# Patient Record
Sex: Male | Born: 1985 | Race: Black or African American | Hispanic: No | Marital: Married | Smoking: Current every day smoker
Health system: Southern US, Community
[De-identification: ages and names within clinical notes are randomized; demographics above are authoritative.]

## PROBLEM LIST (undated history)

## (undated) DIAGNOSIS — E119 Type 2 diabetes mellitus without complications: Secondary | ICD-10-CM

---

## 2011-10-13 ENCOUNTER — Emergency Department: Payer: Self-pay | Admitting: Emergency Medicine

## 2011-12-16 ENCOUNTER — Emergency Department: Payer: Self-pay | Admitting: Emergency Medicine

## 2013-03-16 ENCOUNTER — Emergency Department: Payer: Self-pay | Admitting: Emergency Medicine

## 2014-05-31 ENCOUNTER — Emergency Department: Payer: Self-pay | Admitting: Emergency Medicine

## 2014-05-31 LAB — COMPREHENSIVE METABOLIC PANEL
ALK PHOS: 51 U/L
Albumin: 3.8 g/dL (ref 3.4–5.0)
Anion Gap: 9 (ref 7–16)
BUN: 16 mg/dL (ref 7–18)
Bilirubin,Total: 0.4 mg/dL (ref 0.2–1.0)
CALCIUM: 9 mg/dL (ref 8.5–10.1)
CHLORIDE: 105 mmol/L (ref 98–107)
CREATININE: 1.19 mg/dL (ref 0.60–1.30)
Co2: 25 mmol/L (ref 21–32)
EGFR (Non-African Amer.): >60
GLUCOSE: 123 mg/dL — AB (ref 65–99)
OSMOLALITY: 280 (ref 275–301)
Potassium: 3.8 mmol/L (ref 3.5–5.1)
SGOT(AST): 31 U/L (ref 15–37)
SGPT (ALT): 63 U/L
SODIUM: 139 mmol/L (ref 136–145)
TOTAL PROTEIN: 8.5 g/dL — AB (ref 6.4–8.2)

## 2014-05-31 LAB — URINALYSIS, COMPLETE
BACTERIA: NONE SEEN
Bilirubin,UR: NEGATIVE
Blood: NEGATIVE
GLUCOSE, UR: NEGATIVE mg/dL (ref 0–75)
KETONE: NEGATIVE
Leukocyte Esterase: NEGATIVE
Nitrite: NEGATIVE
PROTEIN: NEGATIVE
Ph: 5 (ref 4.5–8.0)
Specific Gravity: 1.024 (ref 1.003–1.030)

## 2014-05-31 LAB — CBC WITH DIFFERENTIAL/PLATELET
BASOS ABS: 0 10*3/uL (ref 0.0–0.1)
Basophil %: 0.1 %
EOS PCT: 0.7 %
Eosinophil #: 0.1 10*3/uL (ref 0.0–0.7)
HCT: 52.9 % — AB (ref 40.0–52.0)
HGB: 17.5 g/dL (ref 13.0–18.0)
LYMPHS ABS: 0.5 10*3/uL — AB (ref 1.0–3.6)
LYMPHS PCT: 5.6 %
MCH: 29.4 pg (ref 26.0–34.0)
MCHC: 33.1 g/dL (ref 32.0–36.0)
MCV: 89 fL (ref 80–100)
Monocyte #: 0.4 x10 3/mm (ref 0.2–1.0)
Monocyte %: 4.7 %
NEUTROS ABS: 8.1 10*3/uL — AB (ref 1.4–6.5)
NEUTROS PCT: 88.9 %
PLATELETS: 165 10*3/uL (ref 150–440)
RBC: 5.94 10*6/uL — ABNORMAL HIGH (ref 4.40–5.90)
RDW: 14.9 % — ABNORMAL HIGH (ref 11.5–14.5)
WBC: 9.1 10*3/uL (ref 3.8–10.6)

## 2014-09-15 ENCOUNTER — Emergency Department: Payer: Self-pay | Admitting: Emergency Medicine

## 2015-04-05 ENCOUNTER — Emergency Department
Admission: EM | Admit: 2015-04-05 | Discharge: 2015-04-05 | Discharge status: Against Medical Advice | Payer: Self-pay | Attending: Emergency Medicine | Admitting: Emergency Medicine

## 2015-04-05 DIAGNOSIS — J00 Acute nasopharyngitis [common cold]: Secondary | ICD-10-CM | POA: Insufficient documentation

## 2015-04-05 DIAGNOSIS — R0981 Nasal congestion: Secondary | ICD-10-CM | POA: Insufficient documentation

## 2015-04-05 NOTE — ED Notes (Signed)
Congestion x 1 week. Cold sx's.

## 2015-09-28 ENCOUNTER — Emergency Department
Admission: EM | Admit: 2015-09-28 | Discharge: 2015-09-28 | Disposition: A | Discharge status: Home / Self Care | Payer: Self-pay | Attending: Emergency Medicine | Admitting: Emergency Medicine

## 2015-09-28 ENCOUNTER — Encounter: Payer: Self-pay | Admitting: Emergency Medicine

## 2015-09-28 DIAGNOSIS — X58XXXA Exposure to other specified factors, initial encounter: Secondary | ICD-10-CM | POA: Insufficient documentation

## 2015-09-28 DIAGNOSIS — Y9289 Other specified places as the place of occurrence of the external cause: Secondary | ICD-10-CM | POA: Insufficient documentation

## 2015-09-28 DIAGNOSIS — F17200 Nicotine dependence, unspecified, uncomplicated: Secondary | ICD-10-CM | POA: Insufficient documentation

## 2015-09-28 DIAGNOSIS — Y998 Other external cause status: Secondary | ICD-10-CM | POA: Insufficient documentation

## 2015-09-28 DIAGNOSIS — T782XXA Anaphylactic shock, unspecified, initial encounter: Secondary | ICD-10-CM | POA: Insufficient documentation

## 2015-09-28 DIAGNOSIS — L500 Allergic urticaria: Secondary | ICD-10-CM | POA: Insufficient documentation

## 2015-09-28 DIAGNOSIS — Y9389 Activity, other specified: Secondary | ICD-10-CM | POA: Insufficient documentation

## 2015-09-28 MED ORDER — EPINEPHRINE 0.3 MG/0.3ML IJ SOAJ
0.3000 mg | Freq: Once | INTRAMUSCULAR | Status: AC
  Administered 2015-09-28: 0.3 mg via INTRAMUSCULAR

## 2015-09-28 MED ORDER — DIPHENHYDRAMINE HCL 50 MG/ML IJ SOLN
25.0000 mg | Freq: Once | INTRAMUSCULAR | Status: AC
  Administered 2015-09-28: 25 mg via INTRAVENOUS
  Filled 2015-09-28: qty 1

## 2015-09-28 MED ORDER — METHYLPREDNISOLONE SODIUM SUCC 125 MG IJ SOLR
125.0000 mg | Freq: Once | INTRAMUSCULAR | Status: AC
  Administered 2015-09-28: 125 mg via INTRAVENOUS
  Filled 2015-09-28: qty 2

## 2015-09-28 MED ORDER — FAMOTIDINE IN NACL 20-0.9 MG/50ML-% IV SOLN
20.0000 mg | Freq: Once | INTRAVENOUS | Status: AC
  Administered 2015-09-28: 20 mg via INTRAVENOUS
  Filled 2015-09-28: qty 50

## 2015-09-28 MED ORDER — EPINEPHRINE 0.3 MG/0.3ML IJ SOAJ: 0.3000 mg | Freq: Once | INTRAMUSCULAR | Status: DC

## 2015-09-28 MED ORDER — PREDNISONE 10 MG PO TABS: 50.0000 mg | ORAL_TABLET | Freq: Every day | ORAL | Status: DC

## 2015-09-28 NOTE — ED Notes (Signed)
Pt with unknown allergic reaction, rash and arms, chest legs. States that throat feels like its swelling. No resp. Issues at this time.

## 2015-09-28 NOTE — Discharge Instructions (Signed)
You were evaluated after allergic reaction, and although I'm not certain what you are allergic to, it was systemic what we call anaphylaxis.  We discussed, you should continue to take Benadryl over-the-counter every 4 hours for the next 24 hours, and then as needed for any itching or hives.  You should also take over-the-counter Zantac 150 mg once daily for the next 5 days. You're being prescribed prednisone to help keep allergic symptoms down for the next 5 days.  You are being prescribed EpiPen, that you need to keep with you at all times, for use if you have throat swelling or trouble breathing associated with allergic reaction and you cannot access medical care. If you use your EpiPen, you need to call 911 and come to the emergency room for further monitoring.  Return to the emergency department for any worsening condition including lip or throat swelling, trouble breathing, trouble swallowing, dizziness or passing out, or any other symptoms concerning to. I recommended follow-up with the ears nose throat physician for possible allergy testing to see if we might be able to find what you are allergic to.   Anaphylactic Reaction An anaphylactic reaction is a sudden, severe allergic reaction that involves the whole body. It can be life threatening. A hospital stay is often required. People with asthma, eczema, or hay fever are slightly more likely to have an anaphylactic reaction. CAUSES  An anaphylactic reaction may be caused by anything to which you are allergic. After being exposed to the allergic substance, your immune system becomes sensitized to it. When you are exposed to that allergic substance again, an allergic reaction can occur. Common causes of an anaphylactic reaction include:  Medicines.  Foods, especially peanuts, wheat, shellfish, milk, and eggs.  Insect bites or stings.  Blood products.  Chemicals, such as dyes, latex, and contrast material used for imaging  tests. SYMPTOMS  When an allergic reaction occurs, the body releases histamine and other substances. These substances cause symptoms such as tightening of the airway. Symptoms often develop within seconds or minutes of exposure. Symptoms may include:  Skin rash or hives.  Itching.  Chest tightness.  Swelling of the eyes, tongue, or lips.  Trouble breathing or swallowing.  Lightheadedness or fainting.  Anxiety or confusion.  Stomach pains, vomiting, or diarrhea.  Nasal congestion.  A fast or irregular heartbeat (palpitations). DIAGNOSIS  Diagnosis is based on your history of recent exposure to allergic substances, your symptoms, and a physical exam. Your caregiver may also perform blood or urine tests to confirm the diagnosis. TREATMENT  Epinephrine medicine is the main treatment for an anaphylactic reaction. Other medicines that may be used for treatment include antihistamines, steroids, and albuterol. In severe cases, fluids and medicine to support blood pressure may be given through an intravenous line (IV). Even if you improve after treatment, you need to be observed to make sure your condition does not get worse. This may require a stay in the hospital. [place] a medical alert bracelet or necklace stating your allergy.  You and your family must learn how to use an anaphylaxis kit or give an epinephrine injection to temporarily treat an emergency allergic reaction. Always carry your epinephrine injection or anaphylaxis kit with you. This can be lifesaving if you have a severe reaction.  Do not drive or perform tasks after treatment until the medicines used to treat your reaction have worn off, or until your caregiver says it is okay.  If you have hives  or a rash:  Take medicines as directed by your caregiver.  You may use an over-the-counter antihistamine (diphenhydramine) as needed.  Apply cold compresses to the skin or take baths in cool water.  Avoid hot baths or showers. SEEK MEDICAL CARE IF:   You develop symptoms of an allergic reaction to a new substance. Symptoms may start right away or minutes later.  You develop a rash, hives, or itching.  You develop new symptoms. SEEK IMMEDIATE MEDICAL CARE IF:   You have swelling of the mouth, difficulty breathing, or wheezing.  You have a tight feeling in your chest or throat.  You develop hives, swelling, or itching all over your body.  You develop severe vomiting or diarrhea.  You feel faint or pass out. This is an emergency. Use your epinephrine injection or anaphylaxis kit as you have been instructed. Call your local emergency services (911 in [place] Even if you improve after the injection, you need to be examined at a hospital emergency department. MAKE SURE YOU:   Understand these instructions.  Will watch your condition.  Will get help right away if you are not doing well or get worse.   This information is not intended to replace advice given to you by your health care provider. Make sure you discuss any questions you have with your health care provider.   Document Released: 06/16/2005 Document Revised: 06/21/2013 Document Reviewed: 12/27/2014 Elsevier Interactive Patient Education [employer].

## 2015-09-28 NOTE — ED Notes (Signed)
Red, lav, and light green tubes sent to lab

## 2015-09-28 NOTE — ED Provider Notes (Signed)
[facility] Emergency Department Provider Note   ____________________________________________  Time seen: Immediate bleeding upon placement ED room I have reviewed the triage vital signs and the triage nursing note.  HISTORY  Chief Complaint Allergic Reaction   Historian Patient  HPI Alexander Graham is a 30 y.o. male who states this morning he had relatively acute onset of feeling of throat swelling and skin rash or hives with itching. No known trigger. No history of anaphylaxis or allergic reactions. No new medications.  No lip swelling. No wheezing. Positive for redness of the skin of the trunk and arms.    History reviewed. No pertinent past medical history.  There are no active problems to display for this patient.   History reviewed. No pertinent past surgical history.  Current Outpatient Rx  Name  Route  Sig  Dispense  Refill  . EPINEPHrine (EPIPEN 2-PAK) 0.3 mg/0.3 mL IJ SOAJ injection   Intramuscular   Inject 0.3 mLs (0.3 mg total) into the muscle once.   1 Device   2     Allergies Review of patient's allergies indicates no known allergies.  History reviewed. No pertinent family history.  Social History Social History  Substance Use Topics  . Smoking status: Current Every Day Smoker  . Smokeless tobacco: None  . Alcohol Use: No    Review of Systems  Constitutional: Negative for fever. Eyes: Negative for visual changes. ENT: Throat swelling as per history of present illness Cardiovascular: Negative for chest pain. Respiratory: Negative for shortness of breath. Gastrointestinal: Negative for abdominal pain, vomiting and diarrhea. Genitourinary: Negative for dysuria. Musculoskeletal: Negative for back pain. Skin: Diffuse redness/mild hives trunk and extremities. Neurological: Negative for headache. 10 point Review of Systems otherwise negative ____________________________________________   PHYSICAL EXAM:  VITAL  SIGNS: ED Triage Vitals  Enc Vitals Group     BP 09/28/15 1318 137/92 mmHg     Pulse Rate 09/28/15 1318 102     Resp 09/28/15 1400 19     Temp 09/28/15 1318 97.5 F (36.4 C)     Temp Source 09/28/15 1318 Oral     SpO2 09/28/15 1318 97 %     Weight 09/28/15 1318 267 lb (121.11 kg)     Height 09/28/15 1318  (1.854 m)     Head Cir --      Peak Flow --      Pain Score --      Pain Loc --      Pain Edu? --      Excl. in GC? --      Constitutional: Alert and oriented. Well appearing overall and in no distress, , but complaining of throat swelling and trouble swallowing HEENT   Head: Normocephalic and atraumatic.      Eyes: Conjunctivae are normal. PERRL. Normal extraocular movements.      Ears:         Nose: No congestion/rhinnorhea.   Mouth/Throat: Mucous membranes are moist. Palate looks somewhat swollen, unable to visualize uvula.   Neck: No stridor. Cardiovascular/Chest: Normal rate, regular rhythm.  No murmurs, rubs, or gallops. Respiratory: Normal respiratory effort without tachypnea nor retractions. Breath sounds are clear and equal bilaterally. No wheezes/rales/rhonchi. Gastrointestinal: Soft. No distention, no guarding, no rebound. Nontender.    Genitourinary/rectal:Deferred Musculoskeletal: Nontender with normal range of motion in all extremities. No joint effusions.  No lower extremity tenderness.  No edema. Neurologic:  Normal speech and language. No gross or focal neurologic deficits are appreciated. Skin:  Skin  is warm, dry.  Diffuse erythematous chest bilateral arms and legs with mild hives which patient describes as itchy. Psychiatric: Mood and affect are normal. Speech and behavior are normal. Patient exhibits appropriate insight and judgment.  ____________________________________________   EKG I, Governor Rooksebecca Laszlo Ellerby, MD, the attending physician have personally viewed and interpreted all ECGs.  None ____________________________________________  LABS  (pertinent positives/negatives)  None  ____________________________________________  RADIOLOGY All Xrays were viewed by me. Imaging interpreted by Radiologist.  None __________________________________________  PROCEDURES  Procedure(s) performed: None  Critical Care performed: CRITICAL CARE Performed by: Governor RooksLORD, Davelyn Gwinn   Total critical care time: 30 minutes  Critical care time was exclusive of separately billable procedures and treating other patients.  Critical care was necessary to treat or prevent imminent or life-threatening deterioration.  Critical care was time spent personally by me on the following activities: development of treatment plan with patient and/or surrogate as well as nursing, discussions with consultants, evaluation of patient's response to treatment, examination of patient, obtaining history from patient or surrogate, ordering and performing treatments and interventions, ordering and review of laboratory studies, ordering and review of radiographic studies, pulse oximetry and re-evaluation of patient's condition.   ____________________________________________   ED COURSE / ASSESSMENT AND PLAN  Pertinent labs & imaging results that were available during my care of the patient were reviewed by me and considered in my medical decision making (see chart for details).   I was called to the bedside medially for patient complaining of anaphylactic symptoms including diffuse erythema and hives with a complaint of throat swelling. Normal oxygenation, normal lung sounds. He is still complaining of trouble swallowing, and when I looked in the back of his throat it did look like his palate was swollen. Patient was given IM 0.3 mg epinephrine, and then intravenous Benadryl, Pepcid, and Solu-Medrol.  Patient did have resolution of itching and hives and much improvement of erythematous rash, and resolution of throat swelling.  3 hours after observation from the  epinephrine dose, patient stated he wanted to leave. I did discuss with he and his significant other my recommendation of usually watching up to 4 hours after Benadryl, but he would like to go ahead and leave now. I don't think is unreasonable, and he understands return precautions. He is going to go to the pharmacy and pick up his EpiPen now.    CONSULTATIONS:   None   Patient / Family / Caregiver informed of clinical course, medical decision-making process, and agree with plan.   I discussed return precautions, follow-up instructions, and discharged instructions with patient and/or family.   ___________________________________________   FINAL CLINICAL IMPRESSION(S) / ED DIAGNOSES   Final diagnoses:  Anaphylaxis, initial encounter              Note: This dictation was prepared with Dragon dictation. Any transcriptional errors that result from this process are unintentional   Governor Rooksebecca Zaiya Annunziato, MD 09/28/15 1700

## 2016-10-14 ENCOUNTER — Emergency Department
Admission: EM | Admit: 2016-10-14 | Discharge: 2016-10-14 | Disposition: A | Discharge status: Home / Self Care | Payer: Self-pay | Attending: Student in an Organized Health Care Education/Training Program | Admitting: Student in an Organized Health Care Education/Training Program

## 2016-10-14 DIAGNOSIS — F17200 Nicotine dependence, unspecified, uncomplicated: Secondary | ICD-10-CM | POA: Insufficient documentation

## 2016-10-14 DIAGNOSIS — B029 Zoster without complications: Secondary | ICD-10-CM | POA: Insufficient documentation

## 2016-10-14 MED ORDER — TRAMADOL HCL 50 MG PO TABS
50.0000 mg | ORAL_TABLET | Freq: Once | ORAL | Status: AC
  Administered 2016-10-14: 50 mg via ORAL
  Filled 2016-10-14: qty 1

## 2016-10-14 MED ORDER — HYDROXYZINE HCL 50 MG PO TABS: 50.0000 mg | ORAL_TABLET | Freq: Three times a day (TID) | ORAL | 0 refills | Status: DC | PRN

## 2016-10-14 MED ORDER — TRAMADOL HCL 50 MG PO TABS: 50.0000 mg | ORAL_TABLET | Freq: Two times a day (BID) | ORAL | 0 refills | Status: DC | PRN

## 2016-10-14 MED ORDER — ACYCLOVIR 400 MG PO TABS: 400.0000 mg | ORAL_TABLET | Freq: Every day | ORAL | 0 refills | Status: AC

## 2016-10-14 MED ORDER — HYDROXYZINE HCL 50 MG PO TABS
50.0000 mg | ORAL_TABLET | Freq: Once | ORAL | Status: AC
  Administered 2016-10-14: 50 mg via ORAL
  Filled 2016-10-14: qty 1
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
## 2016-10-14 NOTE — ED Triage Notes (Signed)
Pt c/o itchy rash to the lower back since yesterday.Alexander Graham

## 2017-02-22 ENCOUNTER — Encounter: Payer: Self-pay | Admitting: Emergency Medicine

## 2017-02-22 ENCOUNTER — Emergency Department
Admission: EM | Admit: 2017-02-22 | Discharge: 2017-02-22 | Disposition: A | Discharge status: Home / Self Care | Payer: Self-pay | Attending: Emergency Medicine | Admitting: Emergency Medicine

## 2017-02-22 DIAGNOSIS — M79674 Pain in right toe(s): Secondary | ICD-10-CM | POA: Insufficient documentation

## 2017-02-22 DIAGNOSIS — F17200 Nicotine dependence, unspecified, uncomplicated: Secondary | ICD-10-CM | POA: Insufficient documentation

## 2017-02-22 MED ORDER — KETOROLAC TROMETHAMINE 30 MG/ML IJ SOLN
30.0000 mg | Freq: Once | INTRAMUSCULAR | Status: AC
  Administered 2017-02-22: 30 mg via INTRAMUSCULAR
  Filled 2017-02-22: qty 1

## 2017-02-22 MED ORDER — KETOROLAC TROMETHAMINE 10 MG PO TABS: 10.0000 mg | ORAL_TABLET | Freq: Four times a day (QID) | ORAL | 0 refills | Status: AC | PRN

## 2017-02-22 NOTE — ED Provider Notes (Signed)
[facility] Emergency Department Provider Note  ____________________________________________  Time seen: Approximately 4:10 PM  I have reviewed the triage vital signs and the nursing notes.   HISTORY  Chief Complaint Toe Pain    HPI Alexander Graham is a 31 y.o. male presenting to the emergency department with right great toe pain for the past 2 days. Patient states that he stubbed his toe and has felt pain since. He currently rates his pain at 5 out of 10 in intensity. He denies weakness, radiculopathy or changes in sensation. He has been ambulating without difficulty.   History reviewed. No pertinent past medical history.  There are no active problems to display for this patient.   History reviewed. No pertinent surgical history.  Prior to Admission medications   Medication Sig Start Date End Date Taking? Authorizing Provider  EPINEPHrine (EPIPEN 2-PAK) 0.3 mg/0.3 mL IJ SOAJ injection Inject 0.3 mLs (0.3 mg total) into the muscle once. 09/28/15   Governor Rooks, MD  hydrOXYzine (ATARAX/VISTARIL) 50 MG tablet Take 1 tablet (50 mg total) by mouth 3 (three) times daily as needed. 10/14/16   [name] Reining, PA-C  ketorolac (TORADOL) 10 MG tablet Take 1 tablet (10 mg total) by mouth every 6 (six) hours as needed. 02/22/17 02/27/17  Orvil Feil, PA-C  predniSONE (DELTASONE) 10 MG tablet Take 5 tablets (50 mg total) by mouth daily. 09/28/15   Governor Rooks, MD  traMADol (ULTRAM) 50 MG tablet Take 1 tablet (50 mg total) by mouth every 12 (twelve) hours as needed. 10/14/16   [name] Reining, PA-C    Allergies Patient has no known allergies.  No family history on file.  Social History Social History  Substance Use Topics  . Smoking status: Current Every Day Smoker  . Smokeless tobacco: Never Used  . Alcohol use No     Review of Systems  Constitutional: No fever/chills Eyes: No visual changes. No discharge ENT: No upper respiratory  complaints. Cardiovascular: no chest pain. Respiratory: no cough. No SOB. Musculoskeletal: Patient has right great toe pain. Skin: Negative for rash, abrasions, lacerations, ecchymosis. Neurological: Negative for headaches, focal weakness or numbness.   ____________________________________________   PHYSICAL EXAM:  VITAL SIGNS: ED Triage Vitals  Enc Vitals Group     BP 02/22/17 1529 135/74     Pulse Rate 02/22/17 1529 84     Resp 02/22/17 1529 18     Temp 02/22/17 1529 98.9 F (37.2 C)     Temp Source 02/22/17 1529 Oral     SpO2 02/22/17 1529 98 %     Weight 02/22/17 1529 (!) 317 lb (143.8 kg)     Height 02/22/17 1529 6\' 1"  (1.854 m)     Head Circumference --      Peak Flow --      Pain Score 02/22/17 1538 8     Pain Loc --      Pain Edu? --      Excl. in GC? --      Constitutional: Alert and oriented. Well appearing and in no acute distress. Eyes: Conjunctivae are normal. PERRL. EOMI. Head: Atraumatic. Cardiovascular: Normal rate, regular rhythm. Normal S1 and S2.  Good peripheral circulation. Respiratory: Normal respiratory effort without tachypnea or retractions. Lungs CTAB. Good air entry to the bases with no decreased or absent breath sounds. Musculoskeletal: Patient has pain with palpation along the lateral aspect of the right great toe. Palpable dorsalis pedis pulse, right. Patient is able to move all 5 right  toes. Patient performs full range of motion at the right ankle. Neurologic:  Normal speech and language. No gross focal neurologic deficits are appreciated.  Skin: No evidence of subungual hematoma or paronychia. Psychiatric: Mood and affect are normal. Speech and behavior are normal. Patient exhibits appropriate insight and judgement.   ____________________________________________   LABS (all labs ordered are listed, but only abnormal results are displayed)  Labs Reviewed - No data to  display ____________________________________________  EKG   ____________________________________________  RADIOLOGY   No results found.  ____________________________________________    PROCEDURES  Procedure(s) performed:    Procedures    Medications  ketorolac (TORADOL) 30 MG/ML injection 30 mg (not administered)     ____________________________________________   INITIAL IMPRESSION / ASSESSMENT AND PLAN / ED COURSE  Pertinent labs & imaging results that were available during my care of the patient were reviewed by me and considered in my medical decision making (see chart for details).  Review of the [place] CSRS was performed in accordance of the [facility] prior to dispensing any controlled drugs.    Assessment and plan Right great toe pain Patient presents to the emergency department with right great toe pain. Patient declined x-ray examination in the emergency department. On physical exam, there was no evidence of subungual hematoma or paronychia. Right great toenail was firmly seated within the nailbed. Patient was referred to podiatry, Dr. Orland Jarred. Patient was given an injection of Toradol in the emergency department. He was discharged with Toradol. Vital signs are reassuring prior to discharge. All patient questions were answered.   ____________________________________________  FINAL CLINICAL IMPRESSION(S) / ED DIAGNOSES  Final diagnoses:  Great toe pain, right      NEW MEDICATIONS STARTED DURING THIS VISIT:  New Prescriptions   KETOROLAC (TORADOL) 10 MG TABLET    Take 1 tablet (10 mg total) by mouth every 6 (six) hours as needed.        This chart was dictated using voice recognition software/Dragon. Despite best efforts to proofread, errors can occur which can change the meaning. Any change was purely unintentional.    Orvil Feil, PA-C 02/22/17 1615    Nita Sickle, MD 02/22/17 [phone]

## 2017-02-22 NOTE — ED Triage Notes (Signed)
Pt states that he stubbed his toe 2 days ago at work and he thinks his toe nail is now embedded in the side of his toe.

## 2017-02-22 NOTE — ED Notes (Signed)
Se triage note states he stubbed hie toes couple of days ago  conts to have pain at great toe

## 2017-06-14 ENCOUNTER — Emergency Department: Payer: Self-pay

## 2017-06-14 ENCOUNTER — Encounter: Payer: Self-pay | Admitting: Emergency Medicine

## 2017-06-14 ENCOUNTER — Other Ambulatory Visit: Payer: Self-pay

## 2017-06-14 ENCOUNTER — Emergency Department
Admission: EM | Admit: 2017-06-14 | Discharge: 2017-06-14 | Disposition: A | Discharge status: Home / Self Care | Payer: Self-pay | Attending: Emergency Medicine | Admitting: Emergency Medicine

## 2017-06-14 DIAGNOSIS — Y999 Unspecified external cause status: Secondary | ICD-10-CM | POA: Insufficient documentation

## 2017-06-14 DIAGNOSIS — W231XXA Caught, crushed, jammed, or pinched between stationary objects, initial encounter: Secondary | ICD-10-CM | POA: Insufficient documentation

## 2017-06-14 DIAGNOSIS — F17210 Nicotine dependence, cigarettes, uncomplicated: Secondary | ICD-10-CM | POA: Insufficient documentation

## 2017-06-14 DIAGNOSIS — S63610A Unspecified sprain of right index finger, initial encounter: Secondary | ICD-10-CM | POA: Insufficient documentation

## 2017-06-14 DIAGNOSIS — Y93H2 Activity, gardening and landscaping: Secondary | ICD-10-CM | POA: Insufficient documentation

## 2017-06-14 DIAGNOSIS — Y92007 Garden or yard of unspecified non-institutional (private) residence as the place of occurrence of the external cause: Secondary | ICD-10-CM | POA: Insufficient documentation

## 2017-06-14 DIAGNOSIS — L089 Local infection of the skin and subcutaneous tissue, unspecified: Secondary | ICD-10-CM

## 2017-06-14 DIAGNOSIS — Z79899 Other long term (current) drug therapy: Secondary | ICD-10-CM | POA: Insufficient documentation

## 2017-06-14 MED ORDER — NAPROXEN 500 MG PO TABS: 500.0000 mg | ORAL_TABLET | Freq: Two times a day (BID) | ORAL | Status: DC

## 2017-06-14 MED ORDER — SULFAMETHOXAZOLE-TRIMETHOPRIM 800-160 MG PO TABS: 1.0000 | ORAL_TABLET | Freq: Two times a day (BID) | ORAL | 0 refills | Status: DC

## 2017-06-14 MED ORDER — SULFAMETHOXAZOLE-TRIMETHOPRIM 800-160 MG PO TABS
1.0000 | ORAL_TABLET | Freq: Once | ORAL | Status: AC
  Administered 2017-06-14: 1 via ORAL
  Filled 2017-06-14: qty 1

## 2017-06-14 MED ORDER — NAPROXEN 500 MG PO TABS
500.0000 mg | ORAL_TABLET | Freq: Once | ORAL | Status: AC
  Administered 2017-06-14: 500 mg via ORAL
  Filled 2017-06-14: qty 1

## 2017-06-14 NOTE — ED Triage Notes (Signed)
Pt presents to ED via POV c/o R 1st finger pain and swelling after injury about 1 wk ago. Pt states 1st finger was bent straight over towards 5th finger  Pt states pain had improved but is worse today after jamming it again while doing yard work. Small abrasion noted to 2nd knuckle.

## 2017-06-14 NOTE — Discharge Instructions (Signed)
Advised finger splint for 2-3 days.. Take medication as directed.

## 2017-06-14 NOTE — ED Provider Notes (Signed)
[facility] Emergency Department Provider Note   ____________________________________________   First MD Initiated Contact with Patient 06/14/17 1519     (approximate)  I have reviewed the triage vital signs and the nursing notes.   HISTORY  Chief Complaint Finger Injury    HPI Alexander Graham is a 31 y.o. male patient presents with pain and swelling to the right index finger for one week. Patient believe he sprained his finger one week ago and stated the pain was improving to he jammed it again while doing yard work today. Patient also has an abrasion to the dorsal aspect of the proximal phalange of the second digit right hand. Patient has full range of motion to grimace of pain and sensation is intact. Patient rates the pain as 8/10. Patient described a pain as "achy/throbbing". No palliative measures for complaint.  History reviewed. No pertinent past medical history.  There are no active problems to display for this patient.   History reviewed. No pertinent surgical history.  Prior to Admission medications   Medication Sig Start Date End Date Taking? Authorizing Provider  EPINEPHrine (EPIPEN 2-PAK) 0.3 mg/0.3 mL IJ SOAJ injection Inject 0.3 mLs (0.3 mg total) into the muscle once. 09/28/15   Governor RooksLord, Rebecca, MD  hydrOXYzine (ATARAX/VISTARIL) 50 MG tablet Take 1 tablet (50 mg total) by mouth 3 (three) times daily as needed. 10/14/16   [name] ReiningSmith, Shahid Flori K, PA-C  naproxen (NAPROSYN) 500 MG tablet Take 1 tablet (500 mg total) by mouth 2 (two) times daily with a meal. 06/14/17   [name] ReiningSmith, Cher Egnor K, PA-C  predniSONE (DELTASONE) 10 MG tablet Take 5 tablets (50 mg total) by mouth daily. 09/28/15   Governor RooksLord, Rebecca, MD  sulfamethoxazole-trimethoprim (BACTRIM DS,SEPTRA DS) 800-160 MG tablet Take 1 tablet by mouth 2 (two) times daily. 06/14/17   [name] ReiningSmith, Jaleyah Longhi K, PA-C  traMADol (ULTRAM) 50 MG tablet Take 1 tablet (50 mg total) by mouth every 12 (twelve) hours as needed. 10/14/16    [name] ReiningSmith, Hulen Mandler K, PA-C    Allergies Grass extracts [gramineae pollens]  History reviewed. No pertinent family history.  Social History Social History   Tobacco Use  . Smoking status: Current Every Day Smoker  . Smokeless tobacco: Never Used  Substance Use Topics  . Alcohol use: No  . Drug use: No    Review of Systems Constitutional: No fever/chills Eyes: No visual changes. ENT: No sore throat. Cardiovascular: Denies chest pain. Respiratory: Denies shortness of breath. Gastrointestinal: No abdominal pain.  No nausea, no vomiting.  No diarrhea.  No constipation. Genitourinary: Negative for dysuria. Musculoskeletal: Right index finger pain  Skin: Negative for rash. Abrasion dorsal aspect of right index finger Neurological: Negative for headaches, focal weakness or numbness. Allergic/Immunilogical: Grass extracts ____________________________________________   PHYSICAL EXAM:  VITAL SIGNS: ED Triage Vitals  Enc Vitals Group     BP 06/14/17 1418 (!) 143/97     Pulse Rate 06/14/17 1418 77     Resp 06/14/17 1418 13     Temp 06/14/17 1418 98.2 F (36.8 C)     Temp Source 06/14/17 1418 Oral     SpO2 06/14/17 1418 98 %     Weight 06/14/17 1419 (!) 314 lb (142.4 kg)     Height 06/14/17 1419 6\' 1"  (1.854 m)     Head Circumference --      Peak Flow --      Pain Score 06/14/17 1429 8     Pain Loc --      Pain  Edu? --      Excl. in GC? --     Constitutional: Alert and oriented. Well appearing and in no acute distress. Cardiovascular: Normal rate, regular rhythm. Grossly normal heart sounds.  Good peripheral circulation. Respiratory: Normal respiratory effort.  No retractions. Lungs CTAB. Musculoskeletal: No obvious deformity to the right index finger. Patient has moderate edema at the proximal phalange of the index finger.  Neurologic:  Normal speech and language. No gross focal neurologic deficits are appreciated. No gait instability. Skin:  Skin is warm, dry and intact. No  rash noted. Abrasion right index finger Psychiatric: Mood and affect are normal. Speech and behavior are normal.  ____________________________________________   LABS (all labs ordered are listed, but only abnormal results are displayed)  Labs Reviewed - No data to display ____________________________________________  EKG   ____________________________________________  RADIOLOGY  Dg Finger Index Right  Result Date: 06/14/2017 CLINICAL DATA:  Status post injury. EXAM: RIGHT INDEX FINGER 2+V COMPARISON:  None FINDINGS: There is no evidence of fracture or dislocation. There is no evidence of arthropathy or other focal bone abnormality. Soft tissues are unremarkable. IMPRESSION: Negative. Electronically Signed   By: Signa Kellaylor  Stroud M.D.   On: 06/14/2017 15:08    ____________________________________________   PROCEDURES  Procedure(s) performed: None  Procedures  Critical Care performed: No  ____________________________________________   INITIAL IMPRESSION / ASSESSMENT AND PLAN / ED COURSE  As part of my medical decision making, I reviewed the following data within the electronic MEDICAL RECORD NUMBER    Right index finger pain secondary to a sprain and infection. Discussed negative x-ray findings with patient. Patient given discharge care instruction. Patient placed in a finger splint and advised take medication as directed. Examination of the affected digit status post splinting revealed continued to be neurovascular intact. Patient [facility] condition persists more than one week.      ____________________________________________   FINAL CLINICAL IMPRESSION(S) / ED DIAGNOSES  Final diagnoses:  Sprain of right index finger, unspecified site of finger, initial encounter  Skin infection     ED Discharge Orders        Ordered    naproxen (NAPROSYN) 500 MG tablet  2 times daily with meals     06/14/17 1527    sulfamethoxazole-trimethoprim  (BACTRIM DS,SEPTRA DS) 800-160 MG tablet  2 times daily     06/14/17 1527       Note:  This document was prepared using Dragon voice recognition software and may include unintentional dictation errors.    [name] ReiningSmith, Madisun Hargrove K, PA-C 06/14/17 1533    [name] ReiningSmith, Cheryll Keisler K, PA-C 06/14/17 1535    Arnaldo NatalMalinda, Paul F, MD 06/14/17 2011

## 2017-06-14 NOTE — ED Notes (Addendum)
Hurt right index finger on car while washing it. No swelling noted, tenderness reported, CAP refill < 3sec, limited movement of right index finger

## 2017-12-29 ENCOUNTER — Encounter: Payer: Self-pay | Admitting: Emergency Medicine

## 2017-12-29 ENCOUNTER — Other Ambulatory Visit: Payer: Self-pay

## 2017-12-29 ENCOUNTER — Emergency Department
Admission: EM | Admit: 2017-12-29 | Discharge: 2017-12-29 | Disposition: A | Discharge status: Home / Self Care | Payer: Self-pay | Attending: Emergency Medicine | Admitting: Emergency Medicine

## 2017-12-29 DIAGNOSIS — L03311 Cellulitis of abdominal wall: Secondary | ICD-10-CM | POA: Insufficient documentation

## 2017-12-29 DIAGNOSIS — Z87891 Personal history of nicotine dependence: Secondary | ICD-10-CM | POA: Insufficient documentation

## 2017-12-29 DIAGNOSIS — L03818 Cellulitis of other sites: Secondary | ICD-10-CM

## 2017-12-29 MED ORDER — CEPHALEXIN 500 MG PO CAPS: 500.0000 mg | ORAL_CAPSULE | Freq: Three times a day (TID) | ORAL | 0 refills | Status: AC

## 2017-12-29 NOTE — ED Notes (Signed)
Quarter size rash below naval with itch. Dry at this time. Pt unsure origin.

## 2017-12-29 NOTE — ED Triage Notes (Signed)
Pt to ED from home c/o rash to abd under belt line x2 days with some clear drainage.  Denies fevers.

## 2017-12-29 NOTE — ED Provider Notes (Signed)
[facility] Emergency Department Provider Note  ____________________________________________  Time seen: Approximately 5:50 PM  I have reviewed the triage vital signs and the nursing notes.   HISTORY  Chief Complaint Rash    HPI Alexander Graham is a 32 y.o. male presents to the ED with a circumferential region of  erythema at the midline abdomen approximately 1 inch below the bellybutton.  Patient reports that he had a small scab in the affected region and scratched at the area.  Patient reports that rash has increased in size and is becoming tender.  No fever or chills.  No alleviating measures have been attempted.   History reviewed. No pertinent past medical history.  There are no active problems to display for this patient.   History reviewed. No pertinent surgical history.  Prior to Admission medications   Medication Sig Start Date End Date Taking? Authorizing Provider  cephALEXin (KEFLEX) 500 MG capsule Take 1 capsule (500 mg total) by mouth 3 (three) times daily for 10 days. 12/29/17 01/08/18  Orvil FeilWoods, Dennisha Mouser M, PA-C  EPINEPHrine (EPIPEN 2-PAK) 0.3 mg/0.3 mL IJ SOAJ injection Inject 0.3 mLs (0.3 mg total) into the muscle once. 09/28/15   Governor RooksLord, Rebecca, MD  hydrOXYzine (ATARAX/VISTARIL) 50 MG tablet Take 1 tablet (50 mg total) by mouth 3 (three) times daily as needed. 10/14/16   [name] ReiningSmith, Ronald K, PA-C  naproxen (NAPROSYN) 500 MG tablet Take 1 tablet (500 mg total) by mouth 2 (two) times daily with a meal. 06/14/17   [name] ReiningSmith, Ronald K, PA-C  predniSONE (DELTASONE) 10 MG tablet Take 5 tablets (50 mg total) by mouth daily. 09/28/15   Governor RooksLord, Rebecca, MD  sulfamethoxazole-trimethoprim (BACTRIM DS,SEPTRA DS) 800-160 MG tablet Take 1 tablet by mouth 2 (two) times daily. 06/14/17   [name] ReiningSmith, Ronald K, PA-C  traMADol (ULTRAM) 50 MG tablet Take 1 tablet (50 mg total) by mouth every 12 (twelve) hours as needed. 10/14/16   [name] ReiningSmith, Ronald K, PA-C    Allergies Grass extracts  [gramineae pollens]  History reviewed. No pertinent family history.  Social History Social History   Tobacco Use  . Smoking status: Former Games developermoker  . Smokeless tobacco: Never Used  Substance Use Topics  . Alcohol use: No  . Drug use: No     Review of Systems  Constitutional: No fever/chills Eyes: No visual changes. No discharge ENT: No upper respiratory complaints. Cardiovascular: no chest pain. Respiratory: no cough. No SOB. Gastrointestinal: No abdominal pain.  No nausea, no vomiting.  No diarrhea.  No constipation. Genitourinary: Negative for dysuria. No hematuria Musculoskeletal: Negative for musculoskeletal pain. Skin: Patient has rash.  Neurological: Negative for headaches, focal weakness or numbness.   ____________________________________________   PHYSICAL EXAM:  VITAL SIGNS: ED Triage Vitals [12/29/17 1559]  Enc Vitals Group     BP 131/82     Pulse Rate 71     Resp 16     Temp 98.3 F (36.8 C)     Temp Source Oral     SpO2 98 %     Weight (!) 315 lb (142.9 kg)     Height 6' (1.829 m)     Head Circumference      Peak Flow      Pain Score 5     Pain Loc      Pain Edu?      Excl. in GC?      Constitutional: Alert and oriented. Well appearing and in no acute distress. Eyes: Conjunctivae are normal. PERRL. EOMI. Head:  Atraumatic. Cardiovascular: Normal rate, regular rhythm. Normal S1 and S2.  Good peripheral circulation. Respiratory: Normal respiratory effort without tachypnea or retractions. Lungs CTAB. Good air entry to the bases with no decreased or absent breath sounds. Musculoskeletal: Full range of motion to all extremities. No gross deformities appreciated. Neurologic:  Normal speech and language. No gross focal neurologic deficits are appreciated.  Skin: Patient has 2 cm circumferential, erythematous rash at midline abdomen approximately 2 cm below navel.  No fluctuance or induration. Psychiatric: Mood and affect are normal. Speech and  behavior are normal. Patient exhibits appropriate insight and judgement.   ____________________________________________   LABS (all labs ordered are listed, but only abnormal results are displayed)  Labs Reviewed - No data to display ____________________________________________  EKG   ____________________________________________  RADIOLOGY  No results found.  ____________________________________________    PROCEDURES  Procedure(s) performed:    Procedures    Medications - No data to display   ____________________________________________   INITIAL IMPRESSION / ASSESSMENT AND PLAN / ED COURSE  Pertinent labs & imaging results that were available during my care of the patient were reviewed by me and considered in my medical decision making (see chart for details).  Review of the [place] CSRS was performed in accordance of the [facility] prior to dispensing any controlled drugs.    Assessment and plan Cellulitis Patient presents to the emergency department with a 2 cm region of circumferential cellulitis at midline abdomen approximately 1 inch below navel.  Patient was treated empirically with Keflex and advised to follow-up with primary care as needed.     ____________________________________________  FINAL CLINICAL IMPRESSION(S) / ED DIAGNOSES  Final diagnoses:  Cellulitis of other specified site      NEW MEDICATIONS STARTED DURING THIS VISIT:  ED Discharge Orders        Ordered    cephALEXin (KEFLEX) 500 MG capsule  3 times daily     12/29/17 1742          This chart was dictated using voice recognition software/Dragon. Despite best efforts to proofread, errors can occur which can change the meaning. Any change was purely unintentional.    Orvil Feil, PA-C 12/29/17 1755    Myrna Blazer, MD 12/29/17 2113

## 2018-07-04 ENCOUNTER — Other Ambulatory Visit: Payer: Self-pay

## 2018-07-04 ENCOUNTER — Encounter: Payer: Self-pay | Admitting: Emergency Medicine

## 2018-07-04 ENCOUNTER — Emergency Department
Admission: EM | Admit: 2018-07-04 | Discharge: 2018-07-04 | Disposition: A | Discharge status: Home / Self Care | Payer: Self-pay | Attending: Emergency Medicine | Admitting: Emergency Medicine

## 2018-07-04 ENCOUNTER — Emergency Department: Payer: Self-pay

## 2018-07-04 DIAGNOSIS — M7918 Myalgia, other site: Secondary | ICD-10-CM | POA: Insufficient documentation

## 2018-07-04 DIAGNOSIS — R0981 Nasal congestion: Secondary | ICD-10-CM | POA: Insufficient documentation

## 2018-07-04 DIAGNOSIS — Z87891 Personal history of nicotine dependence: Secondary | ICD-10-CM | POA: Insufficient documentation

## 2018-07-04 DIAGNOSIS — J101 Influenza due to other identified influenza virus with other respiratory manifestations: Secondary | ICD-10-CM | POA: Insufficient documentation

## 2018-07-04 LAB — BASIC METABOLIC PANEL
Anion gap: 8 (ref 5–15)
BUN: 11 mg/dL (ref 6–20)
CHLORIDE: 106 mmol/L (ref 98–111)
CO2: 22 mmol/L (ref 22–32)
Calcium: 8.9 mg/dL (ref 8.9–10.3)
Creatinine, Ser: 0.96 mg/dL (ref 0.61–1.24)
GFR calc non Af Amer: >60 mL/min (ref >60)
Glucose, Bld: 104 mg/dL — ABNORMAL HIGH (ref 70–99)
POTASSIUM: 3.8 mmol/L (ref 3.5–5.1)
SODIUM: 136 mmol/L (ref 135–145)

## 2018-07-04 LAB — CBC
HEMATOCRIT: 50.8 % (ref 39.0–52.0)
HEMOGLOBIN: 16.4 g/dL (ref 13.0–17.0)
MCH: 27.4 pg (ref 26.0–34.0)
MCHC: 32.3 g/dL (ref 30.0–36.0)
MCV: 84.9 fL (ref 80.0–100.0)
NRBC: 0 % (ref 0.0–0.2)
Platelets: 155 10*3/uL (ref 150–400)
RBC: 5.98 MIL/uL — AB (ref 4.22–5.81)
RDW: 13.6 % (ref 11.5–15.5)
WBC: 4.7 10*3/uL (ref 4.0–10.5)

## 2018-07-04 LAB — INFLUENZA PANEL BY PCR (TYPE A & B)
INFLAPCR: NEGATIVE
INFLBPCR: POSITIVE — AB

## 2018-07-04 LAB — TROPONIN I: Troponin I: <0.03 ng/mL (ref <0.03)

## 2018-07-04 NOTE — ED Provider Notes (Signed)
[facility] Emergency Department Provider Note  Time seen: 8:02 AM  I have reviewed the triage vital signs and the nursing notes.   HISTORY  Chief Complaint flu like symptoms and Chest Pain    HPI Alexander Graham is a 33 y.o. male with no significant past medical history presents to the emergency department for cough, congestion, chest discomfort, chills and body aches.  According to the patient for the past 4 days he has been experiencing cough and congestion with intermittent chills and body aches.  No known fever.  No nausea or vomiting.  States he has had mild chest discomfort but only when he coughs.  Denies any discomfort at rest.   History reviewed. No pertinent past medical history.  There are no active problems to display for this patient.   History reviewed. No pertinent surgical history.  Prior to Admission medications   Medication Sig Start Date End Date Taking? Authorizing Provider  EPINEPHrine (EPIPEN 2-PAK) 0.3 mg/0.3 mL IJ SOAJ injection Inject 0.3 mLs (0.3 mg total) into the muscle once. 09/28/15   Governor Rooks, MD  hydrOXYzine (ATARAX/VISTARIL) 50 MG tablet Take 1 tablet (50 mg total) by mouth 3 (three) times daily as needed. 10/14/16   [name] Reining, PA-C  naproxen (NAPROSYN) 500 MG tablet Take 1 tablet (500 mg total) by mouth 2 (two) times daily with a meal. 06/14/17   [name] Reining, PA-C  predniSONE (DELTASONE) 10 MG tablet Take 5 tablets (50 mg total) by mouth daily. 09/28/15   Governor Rooks, MD  sulfamethoxazole-trimethoprim (BACTRIM DS,SEPTRA DS) 800-160 MG tablet Take 1 tablet by mouth 2 (two) times daily. 06/14/17   [name] Reining, PA-C  traMADol (ULTRAM) 50 MG tablet Take 1 tablet (50 mg total) by mouth every 12 (twelve) hours as needed. 10/14/16   [name] Reining, PA-C    Allergies  Allergen Reactions  . Grass Extracts [Gramineae Pollens]     Only when wet    No family history on file.  Social History Social History    Tobacco Use  . Smoking status: Former Games developer  . Smokeless tobacco: Never Used  Substance Use Topics  . Alcohol use: Yes  . Drug use: No    Review of Systems Constitutional: Negative for fever.  Positive for chills ENT: Positive for congestion. Cardiovascular: Mild chest discomfort with cough. Respiratory: Negative for shortness of breath.  Positive for cough. Gastrointestinal: Negative for abdominal pain, vomiting Musculoskeletal: Positive for body aches. Skin: Negative for skin complaints  Neurological: Negative for headache All other ROS negative  ____________________________________________   PHYSICAL EXAM:  VITAL SIGNS: ED Triage Vitals  Enc Vitals Group     BP 07/04/18 0722 (!) 137/93     Pulse Rate 07/04/18 0720 94     Resp 07/04/18 0720 16     Temp 07/04/18 0720 99.2 F (37.3 C)     Temp Source 07/04/18 0720 Oral     SpO2 07/04/18 0720 97 %     Weight 07/04/18 0721 (!) 317 lb (143.8 kg)     Height 07/04/18 0721 6' (1.829 m)     Head Circumference --      Peak Flow --      Pain Score 07/04/18 0721 7     Pain Loc --      Pain Edu? --      Excl. in GC? --    Constitutional: Alert and oriented. Well appearing and in no distress. Eyes: Normal exam ENT  Head: Normocephalic and atraumatic.   Mouth/Throat: Mucous membranes are moist.  Minimal pharyngeal erythema.  No cervical lymphadenopathy. Cardiovascular: Normal rate, regular rhythm. Respiratory: Normal respiratory effort without tachypnea nor retractions. Breath sounds are clear Gastrointestinal: Soft and nontender. No distention.   Musculoskeletal: Nontender with normal range of motion in all extremities. Neurologic:  Normal speech and language. No gross focal neurologic deficits  Skin:  Skin is warm, dry and intact.  Psychiatric: Mood and affect are normal.   ____________________________________________    EKG  EKG viewed and interpreted by myself shows a normal sinus rhythm at 95 bpm with  a narrow QRS, normal axis, normal intervals, no ST changes.  ____________________________________________    RADIOLOGY  Negative chest x-ray  ____________________________________________   INITIAL IMPRESSION / ASSESSMENT AND PLAN / ED COURSE  Pertinent labs & imaging results that were available during my care of the patient were reviewed by me and considered in my medical decision making (see chart for details).  Patient presents to the emergency department for cough and congestion, intermittent chills and body aches ongoing x4 days.  Differential would include upper respiratory infection, bronchitis, influenza.  We will check labs, chest x-ray and a flu swab.  Patient agreeable to plan of care.  Patient's lab work is largely within normal limits.  Chest x-ray is negative, EKG is reassuring.  Patient is influenza B positive.  However as the patient symptoms have been ongoing for 4 or 5 days I do not believe Tamiflu would be of much benefit to the patient.  We will discharge with supportive care, over-the-counter medications and hydration. ____________________________________________   FINAL CLINICAL IMPRESSION(S) / ED DIAGNOSES  Influenza   Minna Antis, MD 07/04/18 [phone]

## 2018-07-04 NOTE — ED Triage Notes (Signed)
Pt to ED via POV c/o cough, chills, and body aches x 1 weeks. Pt has been taking OTC medication without relief. Pt is in NAD at this time.

## 2018-07-06 ENCOUNTER — Emergency Department
Admission: EM | Admit: 2018-07-06 | Discharge: 2018-07-06 | Disposition: A | Discharge status: Home / Self Care | Payer: Self-pay | Attending: Emergency Medicine | Admitting: Emergency Medicine

## 2018-07-06 ENCOUNTER — Emergency Department: Payer: Self-pay

## 2018-07-06 ENCOUNTER — Encounter: Payer: Self-pay | Admitting: Emergency Medicine

## 2018-07-06 DIAGNOSIS — J101 Influenza due to other identified influenza virus with other respiratory manifestations: Secondary | ICD-10-CM | POA: Insufficient documentation

## 2018-07-06 DIAGNOSIS — Z79899 Other long term (current) drug therapy: Secondary | ICD-10-CM | POA: Insufficient documentation

## 2018-07-06 DIAGNOSIS — Z87891 Personal history of nicotine dependence: Secondary | ICD-10-CM | POA: Insufficient documentation

## 2018-07-06 DIAGNOSIS — J189 Pneumonia, unspecified organism: Secondary | ICD-10-CM | POA: Insufficient documentation

## 2018-07-06 LAB — CBC
HCT: 50.4 % (ref 39.0–52.0)
Hemoglobin: 16.4 g/dL (ref 13.0–17.0)
MCH: 27.7 pg (ref 26.0–34.0)
MCHC: 32.5 g/dL (ref 30.0–36.0)
MCV: 85.1 fL (ref 80.0–100.0)
PLATELETS: 128 10*3/uL — AB (ref 150–400)
RBC: 5.92 MIL/uL — ABNORMAL HIGH (ref 4.22–5.81)
RDW: 13.4 % (ref 11.5–15.5)
WBC: 3.4 10*3/uL — AB (ref 4.0–10.5)
nRBC: 0 % (ref 0.0–0.2)

## 2018-07-06 LAB — BASIC METABOLIC PANEL
Anion gap: 7 (ref 5–15)
BUN: 11 mg/dL (ref 6–20)
CALCIUM: 8.4 mg/dL — AB (ref 8.9–10.3)
CO2: 23 mmol/L (ref 22–32)
CREATININE: 1.06 mg/dL (ref 0.61–1.24)
Chloride: 105 mmol/L (ref 98–111)
Glucose, Bld: 156 mg/dL — ABNORMAL HIGH (ref 70–99)
POTASSIUM: 3.7 mmol/L (ref 3.5–5.1)
SODIUM: 135 mmol/L (ref 135–145)

## 2018-07-06 MED ORDER — AZITHROMYCIN 250 MG PO TABS: ORAL_TABLET | ORAL | 0 refills | Status: AC

## 2018-07-06 MED ORDER — AZITHROMYCIN 500 MG PO TABS
500.0000 mg | ORAL_TABLET | Freq: Once | ORAL | Status: AC
  Administered 2018-07-06: 500 mg via ORAL
  Filled 2018-07-06: qty 1

## 2018-07-06 MED ORDER — HYDROCOD POLST-CPM POLST ER 10-8 MG/5ML PO SUER: 5.0000 mL | Freq: Two times a day (BID) | ORAL | 0 refills | Status: DC | PRN

## 2018-07-06 MED ORDER — ACETAMINOPHEN 325 MG PO TABS
650.0000 mg | ORAL_TABLET | Freq: Once | ORAL | Status: AC
  Administered 2018-07-06: 650 mg via ORAL
  Filled 2018-07-06: qty 2

## 2018-07-06 MED ORDER — HYDROCOD POLST-CPM POLST ER 10-8 MG/5ML PO SUER
5.0000 mL | Freq: Once | ORAL | Status: AC
  Administered 2018-07-06: 5 mL via ORAL
  Filled 2018-07-06: qty 5

## 2018-07-06 NOTE — ED Triage Notes (Signed)
Pt reports diagnosed with flu on Wednesday and now feels worse. Still with fever, SOB and a headache. Pt last took medication this am at 300am it was alka seltzer plus.

## 2018-07-06 NOTE — ED Notes (Signed)
Patient presents c/o aches all over--reports diagnosed with the flu on Wednesday and feeling worse.  Lab work completed in triage, tylenol given in triage.

## 2018-07-06 NOTE — ED Provider Notes (Signed)
[facility] Emergency Department Provider Note   First MD Initiated Contact with Patient 07/06/18 [phone]     (approximate)  I have reviewed the triage vital signs and the nursing notes.   HISTORY  Chief Complaint Headache; Shortness of Breath; and Fever    HPI Alexander Graham is a 33 y.o. male recently diagnosed with influenza B on 07/04/2018.  Returns to the emergency department with persistent cough fever generalized body aches and congestion not improving with over-the-counter medications.  Patient does admit to positive cigarette use daily.  Of note sick contact with the same patient's father.   Past medical history Influenza B diagnosed on 07/04/2018 There are no active problems to display for this patient.   History reviewed. No pertinent surgical history.  Prior to Admission medications   Medication Sig Start Date End Date Taking? Authorizing Provider  EPINEPHrine (EPIPEN 2-PAK) 0.3 mg/0.3 mL IJ SOAJ injection Inject 0.3 mLs (0.3 mg total) into the muscle once. 09/28/15   Governor Rooks, MD  hydrOXYzine (ATARAX/VISTARIL) 50 MG tablet Take 1 tablet (50 mg total) by mouth 3 (three) times daily as needed. 10/14/16   [name] Reining, PA-C  naproxen (NAPROSYN) 500 MG tablet Take 1 tablet (500 mg total) by mouth 2 (two) times daily with a meal. 06/14/17   [name] Reining, PA-C  predniSONE (DELTASONE) 10 MG tablet Take 5 tablets (50 mg total) by mouth daily. 09/28/15   Governor Rooks, MD  sulfamethoxazole-trimethoprim (BACTRIM DS,SEPTRA DS) 800-160 MG tablet Take 1 tablet by mouth 2 (two) times daily. 06/14/17   [name] Reining, PA-C  traMADol (ULTRAM) 50 MG tablet Take 1 tablet (50 mg total) by mouth every 12 (twelve) hours as needed. 10/14/16   [name] Reining, PA-C    Allergies Grass extracts [gramineae pollens]  No family history on file.  Social History Social History   Tobacco Use  . Smoking status: Former Games developer  . Smokeless tobacco: Never Used    Substance Use Topics  . Alcohol use: Yes  . Drug use: No    Review of Systems Constitutional: No fever/chills Eyes: No visual changes. ENT: No sore throat.  For nasal congestion Cardiovascular: Denies chest pain. Respiratory: Denies shortness of breath.  Positive for cough Gastrointestinal: No abdominal pain.  No nausea, no vomiting.  No diarrhea.  No constipation. Genitourinary: Negative for dysuria. Musculoskeletal: Positive for generalized body aches Integumentary: Negative for rash. Neurological: Negative for headaches, focal weakness or numbness.  ____________________________________________   PHYSICAL EXAM:  VITAL SIGNS: ED Triage Vitals [07/06/18 0745]  Enc Vitals Group     BP (!) 166/98     Pulse Rate 90     Resp 20     Temp 100 F (37.8 C)     Temp Source Oral     SpO2 96 %     Weight (!) 142.4 kg (314 lb)     Height 1.829 m (6')     Head Circumference      Peak Flow      Pain Score 8     Pain Loc      Pain Edu?      Excl. in [place]?     Constitutional: Alert and oriented.  Coughing  eyes: Conjunctivae are normal.  Mouth/Throat: Mucous membranes are moist.  Oropharynx non-erythematous. Neck: No stridor.  No meningeal signs.  Cardiovascular: Normal rate, regular rhythm. Good peripheral circulation. Grossly normal heart sounds. Respiratory: Normal respiratory effort.  No retractions. Lungs CTAB. Gastrointestinal: Soft  and nontender. No distention.   Musculoskeletal: No lower extremity tenderness nor edema. No gross deformities of extremities. Neurologic:  Normal speech and language. No gross focal neurologic deficits are appreciated.  Skin:  Skin is warm, dry and intact. No rash noted. Psychiatric: Mood and affect are normal. Speech and behavior are normal.  ____________________________________________   LABS (all labs ordered are listed, but only abnormal results are displayed)  Labs Reviewed  BASIC METABOLIC PANEL - Abnormal; Notable for the  following components:      Result Value   Glucose, Bld 156 (*)    Calcium 8.4 (*)    All other components within normal limits  CBC - Abnormal; Notable for the following components:   WBC 3.4 (*)    RBC 5.92 (*)    Platelets 128 (*)    All other components within normal limits   ____________________________________________  EKG  ED ECG REPORT I, [place] N , the attending physician, personally viewed and interpreted this ECG.   Date: 07/06/2018  EKG Time: 7:52 AM  Rate: 93  Rhythm: Normal sinus rhythm  Axis: Normal  Intervals: Normal  ST&T Change: None  ____________________________________________  RADIOLOGY I, [place] N , personally viewed and evaluated these images (plain radiographs) as part of my medical decision making, as well as reviewing the written report by the radiologist.  ED MD interpretation: No acute cardiopulmonary disease on chest x-ray per radiologist.  Official radiology report(s): Dg Chest 2 View  Result Date: 07/06/2018 CLINICAL DATA:  Shortness of breath and fever. EXAM: CHEST - 2 VIEW COMPARISON:  Radiographs dated 07/04/2018 FINDINGS: The heart size and mediastinal contours are within normal limits. Both lungs are clear. The visualized skeletal structures are unremarkable. IMPRESSION: No active cardiopulmonary disease.  No change since the prior exam. Electronically Signed   By: Francene Boyers M.D.   On: 07/06/2018 08:25      Procedures   ____________________________________________   INITIAL IMPRESSION / ASSESSMENT AND PLAN / ED COURSE  As part of my medical decision making, I reviewed the following data within the electronic MEDICAL RECORD NUMBER  33 year old male presenting with above-stated history and physical exam secondary to continued cough congestion fever following recent diagnosis of influenza 2 days ago.  On clinical exam rhonchi noted bibasilar.  I reviewed the patient's chest x-ray and concern for possible early bibasilar  infiltrate and as such patient given azithromycin in the emergency department will be prescribed same for home as well as Tussionex. ____________________________________________  FINAL CLINICAL IMPRESSION(S) / ED DIAGNOSES  Final diagnoses:  Influenza B  Community acquired pneumonia, unspecified laterality     MEDICATIONS GIVEN DURING THIS VISIT:  Medications  chlorpheniramine-HYDROcodone (TUSSIONEX) 10-8 MG/5ML suspension 5 mL (has no administration in time range)  azithromycin (ZITHROMAX) tablet 500 mg (has no administration in time range)  acetaminophen (TYLENOL) tablet 650 mg (650 mg Oral Given 07/06/18 0817)     ED Discharge Orders    None       Note:  This document was prepared using Dragon voice recognition software and may include unintentional dictation errors.    Darci Current, MD 07/06/18 [phone]

## 2019-02-01 ENCOUNTER — Encounter: Payer: Self-pay | Admitting: Medical Oncology

## 2019-02-01 ENCOUNTER — Emergency Department: Payer: Self-pay

## 2019-02-01 ENCOUNTER — Other Ambulatory Visit: Payer: Self-pay

## 2019-02-01 ENCOUNTER — Emergency Department
Admission: EM | Admit: 2019-02-01 | Discharge: 2019-02-01 | Disposition: A | Discharge status: Home / Self Care | Payer: Self-pay | Attending: Emergency Medicine | Admitting: Emergency Medicine

## 2019-02-01 DIAGNOSIS — M25511 Pain in right shoulder: Secondary | ICD-10-CM | POA: Insufficient documentation

## 2019-02-01 DIAGNOSIS — Z87891 Personal history of nicotine dependence: Secondary | ICD-10-CM | POA: Insufficient documentation

## 2019-02-01 MED ORDER — NAPROXEN 500 MG PO TABS: 500.0000 mg | ORAL_TABLET | Freq: Two times a day (BID) | ORAL | 0 refills | Status: DC

## 2019-02-01 NOTE — Discharge Instructions (Addendum)
Follow-up with Dr. Roland Rack if any continued shoulder pain.  Begin taking naproxen 500 mg twice a day with food.  You may use ice packs to your shoulder as needed for discomfort.  You cannot take ibuprofen or Advil with this medication.  Your blood pressure in triage was elevated with the bottom number being 98.  If anyone in your family has hypertension you should follow-up and have your blood pressure checked more often.

## 2019-02-01 NOTE — ED Provider Notes (Signed)
[facility] Emergency Department Provider Note   ____________________________________________   First MD Initiated Contact with Patient 02/01/19 1001     (approximate)  I have reviewed the triage vital signs and the nursing notes.   HISTORY  Chief Complaint Shoulder Pain   HPI Alexander Graham is a 33 y.o. male to the ED with complaint of right shoulder pain.  Patient states that he felt his shoulder pop 4 days ago while he was washing his truck.  He reports that he has a history of a dislocated shoulder and that this continues to hurt.  He has pain with range of motion.  He has not taken any over-the-counter medication.  He rates his pain as an 8 out of 10.       History reviewed. No pertinent past medical history.  There are no active problems to display for this patient.   History reviewed. No pertinent surgical history.  Prior to Admission medications   Medication Sig Start Date End Date Taking? Authorizing Provider  EPINEPHrine (EPIPEN 2-PAK) 0.3 mg/0.3 mL IJ SOAJ injection Inject 0.3 mLs (0.3 mg total) into the muscle once. 09/28/15   Lisa Roca, MD  naproxen (NAPROSYN) 500 MG tablet Take 1 tablet (500 mg total) by mouth 2 (two) times daily with a meal. 02/01/19   Johnn Hai, PA-C    Allergies Grass extracts [gramineae pollens]  No family history on file.  Social History Social History   Tobacco Use  . Smoking status: Former Research scientist (life sciences)  . Smokeless tobacco: Never Used  Substance Use Topics  . Alcohol use: Yes  . Drug use: No    Review of Systems Constitutional: No fever/chills Cardiovascular: Denies chest pain. Respiratory: Denies shortness of breath. Gastrointestinal: No abdominal pain.  No nausea, no vomiting. Musculoskeletal: Positive right shoulder pain. Skin: Negative for rash. Neurological: Negative for headaches, focal weakness or numbness. ____________________________________________   PHYSICAL EXAM:  VITAL  SIGNS: ED Triage Vitals  Enc Vitals Group     BP 02/01/19 0955 (!) 127/98     Pulse Rate 02/01/19 0955 85     Resp 02/01/19 0955 18     Temp 02/01/19 0955 98.7 F (37.1 C)     Temp Source 02/01/19 0955 Oral     SpO2 02/01/19 0955 97 %     Weight 02/01/19 0954 (!) 313 lb (142 kg)     Height 02/01/19 0954 6' (1.829 m)     Head Circumference --      Peak Flow --      Pain Score 02/01/19 0954 8     Pain Loc --      Pain Edu? --      Excl. in [place]? --    Constitutional: Alert and oriented. Well appearing and in no acute distress. Eyes: Conjunctivae are normal.  Head: Atraumatic. Neck: No stridor.   Cardiovascular: Normal rate, regular rhythm. Grossly normal heart sounds.  Good peripheral circulation. Respiratory: Normal respiratory effort.  No retractions. Lungs CTAB. Musculoskeletal: Examination of the right shoulder there is no gross deformity.  There is tenderness on palpation of the anterior AC joint area.  No soft tissue discoloration or swelling is noted.  Patient range of motion is without crepitus.  Range of motion is slightly decreased secondary to discomfort.  He is able to abduct to approximately 45 degrees before having discomfort.  Skin is intact.  Motor sensory function intact. Neurologic:  Normal speech and language. No gross focal neurologic deficits are appreciated. No  gait instability. Skin:  Skin is warm, dry and intact. No rash noted. Psychiatric: Mood and affect are normal. Speech and behavior are normal.  ____________________________________________   LABS (all labs ordered are listed, but only abnormal results are displayed)  Labs Reviewed - No data to display RADIOLOGY  Official radiology report(s): Dg Shoulder Right  Result Date: 02/01/2019 CLINICAL DATA:  Right shoulder pain after washing car 3 days ago. EXAM: RIGHT SHOULDER - 2+ VIEW COMPARISON:  None. FINDINGS: The joint spaces are maintained. No acute bony findings or bone lesion. No abnormal soft tissue  calcifications. The visualized lung is clear and the visualized ribs are intact. IMPRESSION: Normal right shoulder radiographs. Electronically Signed   By: Rudie MeyerP.  Gallerani M.D.   On: 02/01/2019 10:34    ____________________________________________   PROCEDURES  Procedure(s) performed (including Critical Care):  Procedures   ____________________________________________   INITIAL IMPRESSION / ASSESSMENT AND PLAN / ED COURSE  As part of my medical decision making, I reviewed the following data within the electronic MEDICAL RECORD NUMBER Notes from prior ED visits and [place] Controlled Substance Database  33 year old male presents to the ED with complaint of right shoulder pain for 4 days after washing his truck.  Patient has a history of dislocated shoulder and is concerned for this.  Physical exam is unremarkable.  X-rays were negative for a dislocation and patient was reassured.  He was given a prescription for naproxen 500 mg twice daily with food.  He is encouraged to use ice and continue to use his arm slowly.  He is to follow-up with Dr. Joice LoftsPoggi if any continued problems with his shoulder.  ____________________________________________   FINAL CLINICAL IMPRESSION(S) / ED DIAGNOSES  Final diagnoses:  Acute pain of right shoulder     ED Discharge Orders         Ordered    naproxen (NAPROSYN) 500 MG tablet  2 times daily with meals     02/01/19 1103           Note:  This document was prepared using Dragon voice recognition software and may include unintentional dictation errors.    Tommi RumpsSummers, Yaron Grasse L, PA-C 02/01/19 1400    Chesley NoonJessup, Charles, MD 02/01/19 [phone]

## 2019-02-01 NOTE — ED Triage Notes (Signed)
Pt reports that he was washing his car Thursday when he felt a pop in his rt shoulder. Pt reports pain in shoulder since.

## 2019-02-01 NOTE — ED Notes (Signed)
See triage note   States he was washing his truck last Thursday  Felt a "pop" to right shoulder pain is mainly anterior  Increased pain with movement

## 2019-11-14 ENCOUNTER — Emergency Department
Admission: EM | Admit: 2019-11-14 | Discharge: 2019-11-14 | Disposition: A | Discharge status: Home / Self Care | Payer: Self-pay | Attending: Emergency Medicine | Admitting: Emergency Medicine

## 2019-11-14 ENCOUNTER — Other Ambulatory Visit: Payer: Self-pay

## 2019-11-14 ENCOUNTER — Emergency Department: Payer: Self-pay

## 2019-11-14 DIAGNOSIS — Z87891 Personal history of nicotine dependence: Secondary | ICD-10-CM | POA: Insufficient documentation

## 2019-11-14 DIAGNOSIS — Z79899 Other long term (current) drug therapy: Secondary | ICD-10-CM | POA: Insufficient documentation

## 2019-11-14 DIAGNOSIS — J40 Bronchitis, not specified as acute or chronic: Secondary | ICD-10-CM | POA: Insufficient documentation

## 2019-11-14 DIAGNOSIS — Z20822 Contact with and (suspected) exposure to covid-19: Secondary | ICD-10-CM | POA: Insufficient documentation

## 2019-11-14 LAB — BASIC METABOLIC PANEL
Anion gap: 8 (ref 5–15)
BUN: 16 mg/dL (ref 6–20)
CO2: 26 mmol/L (ref 22–32)
Calcium: 9.1 mg/dL (ref 8.9–10.3)
Chloride: 106 mmol/L (ref 98–111)
Creatinine, Ser: 1.01 mg/dL (ref 0.61–1.24)
GFR calc Af Amer: >60 mL/min (ref >60)
GFR calc non Af Amer: >60 mL/min (ref >60)
Glucose, Bld: 101 mg/dL — ABNORMAL HIGH (ref 70–99)
Potassium: 3.8 mmol/L (ref 3.5–5.1)
Sodium: 140 mmol/L (ref 135–145)

## 2019-11-14 LAB — CBC
HCT: 48.5 % (ref 39.0–52.0)
Hemoglobin: 16.1 g/dL (ref 13.0–17.0)
MCH: 28.5 pg (ref 26.0–34.0)
MCHC: 33.2 g/dL (ref 30.0–36.0)
MCV: 85.8 fL (ref 80.0–100.0)
Platelets: 165 10*3/uL (ref 150–400)
RBC: 5.65 MIL/uL (ref 4.22–5.81)
RDW: 13.5 % (ref 11.5–15.5)
WBC: 7.9 10*3/uL (ref 4.0–10.5)
nRBC: 0 % (ref 0.0–0.2)

## 2019-11-14 LAB — SARS CORONAVIRUS 2 (TAT 6-24 HRS): SARS Coronavirus 2: NEGATIVE

## 2019-11-14 LAB — TROPONIN I (HIGH SENSITIVITY): Troponin I (High Sensitivity): 4 ng/L (ref <18)

## 2019-11-14 MED ORDER — ALBUTEROL SULFATE HFA 108 (90 BASE) MCG/ACT IN AERS: 2.0000 | INHALATION_SPRAY | Freq: Four times a day (QID) | RESPIRATORY_TRACT | 0 refills | Status: DC | PRN

## 2019-11-14 NOTE — ED Notes (Signed)
MD at bedside. 

## 2019-11-14 NOTE — ED Triage Notes (Signed)
Pt c/o dry cough with SOB and pain in chest for the past 3 days, pt is in NAD, pt on his cell phone in triage.

## 2019-11-14 NOTE — ED Provider Notes (Signed)
[facility] Emergency Department Provider Note   ____________________________________________   First MD Initiated Contact with Patient 11/14/19 1224     (approximate)  I have reviewed the triage vital signs and the nursing notes.   HISTORY  Chief Complaint Shortness of Breath    HPI Alexander Graham is a 34 y.o. male with no significant past medical history who presents to the ED complaining of cough and shortness of breath.  Patient reports he has been dealing with 3 to 4 days of constant dry cough as well as occasional difficulty breathing and pain in his chest.  He describes the pain in his chest as a squeezing that is worse whenever he goes to cough.  Symptoms have been particularly bad at night and he states it has been difficult for him to sleep.  He did have a fever as high as 101 2 days ago, but more recently has been checking his temperature and it has been normal.  He is not aware of any sick contacts and denies any vomiting or diarrhea.  He has not yet received a COVID-19 vaccine.        History reviewed. No pertinent past medical history.  There are no problems to display for this patient.   History reviewed. No pertinent surgical history.  Prior to Admission medications   Medication Sig Start Date End Date Taking? Authorizing Provider  albuterol (VENTOLIN HFA) 108 (90 Base) MCG/ACT inhaler Inhale 2 puffs into the lungs every 6 (six) hours as needed for wheezing or shortness of breath. 11/14/19   Blake Divine, MD  EPINEPHrine (EPIPEN 2-PAK) 0.3 mg/0.3 mL IJ SOAJ injection Inject 0.3 mLs (0.3 mg total) into the muscle once. 09/28/15   Lisa Roca, MD  naproxen (NAPROSYN) 500 MG tablet Take 1 tablet (500 mg total) by mouth 2 (two) times daily with a meal. 02/01/19   Johnn Hai, PA-C    Allergies Grass extracts [gramineae pollens]  No family history on file.  Social History Social History   Tobacco Use  . Smoking status: Former  Smoker    Types: Cigarettes  . Smokeless tobacco: Never Used  Substance Use Topics  . Alcohol use: Yes  . Drug use: No    Review of Systems  Constitutional: Positive for fever/chills Eyes: No visual changes. ENT: No sore throat. Cardiovascular: Positive for chest pain. Respiratory: Positive for cough and shortness of breath. Gastrointestinal: No abdominal pain.  No nausea, no vomiting.  No diarrhea.  No constipation. Genitourinary: Negative for dysuria. Musculoskeletal: Negative for back pain. Skin: Negative for rash. Neurological: Negative for headaches, focal weakness or numbness.  ____________________________________________   PHYSICAL EXAM:  VITAL SIGNS: ED Triage Vitals  Enc Vitals Group     BP 11/14/19 0909 134/85     Pulse Rate 11/14/19 0909 77     Resp 11/14/19 0909 19     Temp 11/14/19 0909 98.4 F (36.9 C)     Temp Source 11/14/19 0909 Oral     SpO2 11/14/19 0909 98 %     Weight 11/14/19 0910 (!) 316 lb (143.3 kg)     Height 11/14/19 0910 6' (1.829 m)     Head Circumference --      Peak Flow --      Pain Score 11/14/19 0912 7     Pain Loc --      Pain Edu? --      Excl. in [place]? --     Constitutional: Alert and oriented. Eyes:  Conjunctivae are normal. Head: Atraumatic. Nose: No congestion/rhinnorhea. Mouth/Throat: Mucous membranes are moist. Neck: Normal ROM Cardiovascular: Normal rate, regular rhythm. Grossly normal heart sounds. Respiratory: Normal respiratory effort.  No retractions. Lungs CTAB. Gastrointestinal: Soft and nontender. No distention. Genitourinary: deferred Musculoskeletal: No lower extremity tenderness nor edema. Neurologic:  Normal speech and language. No gross focal neurologic deficits are appreciated. Skin:  Skin is warm, dry and intact. No rash noted. Psychiatric: Mood and affect are normal. Speech and behavior are normal.  ____________________________________________   LABS (all labs ordered are listed, but only abnormal  results are displayed)  Labs Reviewed  BASIC METABOLIC PANEL - Abnormal; Notable for the following components:      Result Value   Glucose, Bld 101 (*)    All other components within normal limits  SARS CORONAVIRUS 2 (TAT 6-24 HRS)  CBC  TROPONIN I (HIGH SENSITIVITY)  TROPONIN I (HIGH SENSITIVITY)   ____________________________________________  EKG  ED ECG REPORT I, Chesley Noon, the attending physician, personally viewed and interpreted this ECG.   Date: 11/14/2019  EKG Time: 9:10  Rate: 73  Rhythm: normal sinus rhythm, sinus arrhythmia  Axis: Normal  Intervals:none  ST&T Change: None   PROCEDURES  Procedure(s) performed (including Critical Care):  Procedures   ____________________________________________   INITIAL IMPRESSION / ASSESSMENT AND PLAN / ED COURSE       34 year old male with no significant past medical history presents to the ED complaining of 3 to 4 days of nonproductive cough, chest pain, shortness of breath, and fever.  He is not in any respiratory distress, is breathing comfortably on stretcher with normal O2 sats.  Symptoms are atypical for ACS, EKG without evidence of arrhythmia or ischemia and troponin is negative.  I also doubt PE given his reassuring vital signs, infectious etiology seems more likely.  Differential includes bronchitis, COVID-19, or other viral illness.  We will perform COVID-19 testing and patient was counseled to isolate until results are known.  We will prescribe inhaler and he was counseled to return to the ED for new or worsening symptoms.  Patient agrees with plan.      ____________________________________________   FINAL CLINICAL IMPRESSION(S) / ED DIAGNOSES  Final diagnoses:  Bronchitis  Suspected COVID-19 virus infection     ED Discharge Orders         Ordered    albuterol (VENTOLIN HFA) 108 (90 Base) MCG/ACT inhaler  Every 6 hours PRN    Note to Pharmacy: Please supply with spacer   11/14/19 1241             Note:  This document was prepared using Dragon voice recognition software and may include unintentional dictation errors.   Chesley Noon, MD 11/14/19 1247

## 2019-11-17 ENCOUNTER — Emergency Department: Payer: Self-pay

## 2019-11-17 ENCOUNTER — Other Ambulatory Visit: Payer: Self-pay

## 2019-11-17 ENCOUNTER — Emergency Department
Admission: EM | Admit: 2019-11-17 | Discharge: 2019-11-17 | Disposition: A | Discharge status: Home / Self Care | Payer: Self-pay | Attending: Emergency Medicine | Admitting: Emergency Medicine

## 2019-11-17 DIAGNOSIS — Z5321 Procedure and treatment not carried out due to patient leaving prior to being seen by health care provider: Secondary | ICD-10-CM | POA: Insufficient documentation

## 2019-11-17 DIAGNOSIS — R0789 Other chest pain: Secondary | ICD-10-CM | POA: Insufficient documentation

## 2019-11-17 LAB — BASIC METABOLIC PANEL
Anion gap: 9 (ref 5–15)
BUN: 21 mg/dL — ABNORMAL HIGH (ref 6–20)
CO2: 23 mmol/L (ref 22–32)
Calcium: 8.7 mg/dL — ABNORMAL LOW (ref 8.9–10.3)
Chloride: 107 mmol/L (ref 98–111)
Creatinine, Ser: 1.2 mg/dL (ref 0.61–1.24)
GFR calc Af Amer: >60 mL/min (ref >60)
GFR calc non Af Amer: >60 mL/min (ref >60)
Glucose, Bld: 100 mg/dL — ABNORMAL HIGH (ref 70–99)
Potassium: 3.7 mmol/L (ref 3.5–5.1)
Sodium: 139 mmol/L (ref 135–145)

## 2019-11-17 LAB — CBC
HCT: 48.1 % (ref 39.0–52.0)
Hemoglobin: 16.2 g/dL (ref 13.0–17.0)
MCH: 28.3 pg (ref 26.0–34.0)
MCHC: 33.7 g/dL (ref 30.0–36.0)
MCV: 84.1 fL (ref 80.0–100.0)
Platelets: 160 10*3/uL (ref 150–400)
RBC: 5.72 MIL/uL (ref 4.22–5.81)
RDW: 13.6 % (ref 11.5–15.5)
WBC: 7.8 10*3/uL (ref 4.0–10.5)
nRBC: 0 % (ref 0.0–0.2)

## 2019-11-17 LAB — TROPONIN I (HIGH SENSITIVITY): Troponin I (High Sensitivity): 3 ng/L (ref <18)

## 2019-11-17 LAB — GLUCOSE, CAPILLARY: Glucose-Capillary: 87 mg/dL (ref 70–99)

## 2019-11-17 NOTE — ED Notes (Signed)
Called X 1 no answer 

## 2019-11-17 NOTE — ED Triage Notes (Signed)
To ED via POV from home c/o "feeling weird" and "chest tightness" since Tuesday. States he was dx with bronchitis on Tuesday and prescribed inhaler. States since using inhaler he has had no appetite, continued dry cough and continued chest tightness. Pt alert and oriented X4, cooperative, RR even and unlabored, color WNL. Pt in NAD.

## 2019-11-17 NOTE — ED Notes (Signed)
Pt called from [facility] to treatment room x's 3, no response 

## 2019-11-17 NOTE — ED Triage Notes (Signed)
Pt called from [facility] to treatment room, no response 

## 2019-11-17 NOTE — ED Triage Notes (Signed)
Pt called from [facility] to treatment room, no response ?

## 2020-01-10 ENCOUNTER — Emergency Department
Admission: EM | Admit: 2020-01-10 | Discharge: 2020-01-10 | Disposition: A | Discharge status: Home / Self Care | Payer: Self-pay | Attending: Emergency Medicine | Admitting: Emergency Medicine

## 2020-01-10 ENCOUNTER — Other Ambulatory Visit: Payer: Self-pay

## 2020-01-10 ENCOUNTER — Encounter: Payer: Self-pay | Admitting: Emergency Medicine

## 2020-01-10 DIAGNOSIS — J020 Streptococcal pharyngitis: Secondary | ICD-10-CM | POA: Insufficient documentation

## 2020-01-10 DIAGNOSIS — F17210 Nicotine dependence, cigarettes, uncomplicated: Secondary | ICD-10-CM | POA: Insufficient documentation

## 2020-01-10 DIAGNOSIS — Z20822 Contact with and (suspected) exposure to covid-19: Secondary | ICD-10-CM | POA: Insufficient documentation

## 2020-01-10 LAB — SARS CORONAVIRUS 2 BY RT PCR (HOSPITAL ORDER, PERFORMED IN ~~LOC~~ HOSPITAL LAB): SARS Coronavirus 2: NEGATIVE

## 2020-01-10 LAB — GROUP A STREP BY PCR: Group A Strep by PCR: DETECTED — AB

## 2020-01-10 MED ORDER — AMOXICILLIN 500 MG PO CAPS: 500.0000 mg | ORAL_CAPSULE | Freq: Three times a day (TID) | ORAL | 0 refills | Status: DC

## 2020-01-10 NOTE — ED Provider Notes (Signed)
[facility] Emergency Department Provider Note   ____________________________________________   First MD Initiated Contact with Patient 01/10/20 1035     (approximate)  I have reviewed the triage vital signs and the nursing notes.   HISTORY  Chief Complaint No chief complaint on file.    HPI Alexander Graham is a 34 y.o. male patient states sore throat 3 days ago and loss of taste 2 days ago.  Patient denies any other signs symptoms associated with viral infection.  Patient denies recent travel or known exposure to COVID-19.  Patient has not taken vaccine.       History reviewed. No pertinent past medical history.  There are no problems to display for this patient.   History reviewed. No pertinent surgical history.  Prior to Admission medications   Medication Sig Start Date End Date Taking? Authorizing Provider  albuterol (VENTOLIN HFA) 108 (90 Base) MCG/ACT inhaler Inhale 2 puffs into the lungs every 6 (six) hours as needed for wheezing or shortness of breath. 11/14/19   Chesley Noon, MD  amoxicillin (AMOXIL) 500 MG capsule Take 1 capsule (500 mg total) by mouth 3 (three) times daily. 01/10/20   [name] Reining, PA-C  EPINEPHrine (EPIPEN 2-PAK) 0.3 mg/0.3 mL IJ SOAJ injection Inject 0.3 mLs (0.3 mg total) into the muscle once. 09/28/15   Governor Rooks, MD    Allergies Grass extracts [gramineae pollens]  No family history on file.  Social History Social History   Tobacco Use  . Smoking status: Current Every Day Smoker    Types: Cigarettes  . Smokeless tobacco: Never Used  Substance Use Topics  . Alcohol use: Yes  . Drug use: No    Review of Systems Constitutional: No fever/chills Eyes: No visual changes. ENT: Sore throat and loss of taste.   Cardiovascular: Denies chest pain. Respiratory: Denies shortness of breath. Gastrointestinal: No abdominal pain.  No nausea, no vomiting.  No diarrhea.  No constipation. Genitourinary: Negative  for dysuria. Musculoskeletal: Negative for back pain. Skin: Negative for rash. Neurological: Negative for headaches, focal weakness or numbness.   ____________________________________________   PHYSICAL EXAM:  VITAL SIGNS: ED Triage Vitals  Enc Vitals Group     BP 01/10/20 0636 (!) 143/87     Pulse Rate 01/10/20 0636 85     Resp 01/10/20 0636 18     Temp 01/10/20 0636 98.1 F (36.7 C)     Temp Source 01/10/20 0636 Oral     SpO2 01/10/20 0636 97 %     Weight 01/10/20 0634 (!) 312 lb (141.5 kg)     Height 01/10/20 0634 6' (1.829 m)     Head Circumference --      Peak Flow --      Pain Score 01/10/20 0634 0     Pain Loc --      Pain Edu? --      Excl. in GC? --    Constitutional: Alert and oriented. Well appearing and in no acute distress. Nose: No congestion/rhinnorhea. Mouth/Throat: Mucous membranes are moist.  Oropharynx erythematous. Neck: No stridor.  No cervical spine tenderness to palpation. Hematological/Lymphatic/Immunilogical: No cervical lymphadenopathy. Cardiovascular: Normal rate, regular rhythm. Grossly normal heart sounds.  Good peripheral circulation. Respiratory: Normal respiratory effort.  No retractions. Lungs CTAB. Neurologic:  Normal speech and language. No gross focal neurologic deficits are appreciated. No gait instability. Skin:  Skin is warm, dry and intact. No rash noted. Psychiatric: Mood and affect are normal. Speech and behavior are normal.  ____________________________________________   LABS (all labs ordered are listed, but only abnormal results are displayed)  Labs Reviewed  GROUP A STREP BY PCR - Abnormal; Notable for the following components:      Result Value   Group A Strep by PCR DETECTED (*)    All other components within normal limits  SARS CORONAVIRUS 2 BY RT PCR (HOSPITAL ORDER, PERFORMED IN [facility])    ____________________________________________  EKG   ____________________________________________  RADIOLOGY  ED MD interpretation:    Official radiology report(s): No results found.  ____________________________________________   PROCEDURES  Procedure(s) performed (including Critical Care):  Procedures   ____________________________________________   INITIAL IMPRESSION / ASSESSMENT AND PLAN / ED COURSE  As part of my medical decision making, I reviewed the following data within the electronic MEDICAL RECORD NUMBER     Patient presents with sore throat and loss of taste for 2 days.  Discussed negative COVID-19 test results with patient.  Patient with positive strep pharyngitis.  Patient given discharge care instruction advised take medication as directed.  Patient advised [facility].    Alexander Graham was evaluated in Emergency Department on 01/10/2020 for the symptoms described in the history of present illness. He was evaluated in the context of the global COVID-19 pandemic, which necessitated consideration that the patient might be at risk for infection with the SARS-CoV-2 virus that causes COVID-19. Institutional protocols and algorithms that pertain to the evaluation of patients at risk for COVID-19 are in a state of rapid change based on information released by regulatory bodies including the CDC and federal and state organizations. These policies and algorithms were followed during the patient's care in the ED.       ____________________________________________   FINAL CLINICAL IMPRESSION(S) / ED DIAGNOSES  Final diagnoses:  Pharyngitis due to group A beta hemolytic Streptococci     ED Discharge Orders         Ordered    amoxicillin (AMOXIL) 500 MG capsule  3 times daily     Discontinue  Reprint     01/10/20 1219           Note:  This document was prepared using Dragon voice recognition software and may include unintentional  dictation errors.    [name] Reining, PA-C 01/10/20 1221    Emily Filbert, MD 01/10/20 [phone]

## 2020-01-10 NOTE — Discharge Instructions (Signed)
Follow discharge care instruction take medication as directed. °

## 2020-01-10 NOTE — ED Triage Notes (Signed)
Patient ambulatory to triage with steady gait, without difficulty or distress noted; pt reports loss of taste x 2 days with no other accomp symptoms

## 2020-01-10 NOTE — ED Notes (Signed)
See triage note  Presents with sore throat and loss of taste  Unsure of fever  Positive sore throat

## 2020-01-10 NOTE — ED Notes (Signed)
called no answer

## 2020-01-10 NOTE — ED Notes (Signed)
Cannot find pt for room. 

## 2020-01-10 NOTE — ED Notes (Signed)
No answer when called 

## 2020-01-24 ENCOUNTER — Encounter: Payer: Self-pay | Admitting: Emergency Medicine

## 2020-01-24 ENCOUNTER — Other Ambulatory Visit: Payer: Self-pay

## 2020-01-24 ENCOUNTER — Emergency Department
Admission: EM | Admit: 2020-01-24 | Discharge: 2020-01-24 | Disposition: A | Discharge status: Home / Self Care | Payer: Self-pay | Attending: Emergency Medicine | Admitting: Emergency Medicine

## 2020-01-24 DIAGNOSIS — R519 Headache, unspecified: Secondary | ICD-10-CM | POA: Insufficient documentation

## 2020-01-24 DIAGNOSIS — F17210 Nicotine dependence, cigarettes, uncomplicated: Secondary | ICD-10-CM | POA: Insufficient documentation

## 2020-01-24 MED ORDER — NAPROXEN 500 MG PO TABS
500.0000 mg | ORAL_TABLET | Freq: Once | ORAL | Status: AC
  Administered 2020-01-24: 500 mg via ORAL
  Filled 2020-01-24: qty 1

## 2020-01-24 MED ORDER — METOCLOPRAMIDE HCL 10 MG PO TABS
10.0000 mg | ORAL_TABLET | Freq: Once | ORAL | Status: AC
  Administered 2020-01-24: 10 mg via ORAL
  Filled 2020-01-24: qty 1

## 2020-01-24 MED ORDER — METOCLOPRAMIDE HCL 5 MG PO TABS: 5.0000 mg | ORAL_TABLET | Freq: Three times a day (TID) | ORAL | 0 refills | Status: DC | PRN

## 2020-01-24 MED ORDER — ACETAMINOPHEN 325 MG PO TABS
650.0000 mg | ORAL_TABLET | Freq: Once | ORAL | Status: AC
  Administered 2020-01-24: 650 mg via ORAL
  Filled 2020-01-24: qty 2

## 2020-01-24 NOTE — ED Triage Notes (Signed)
Pt here for headache over frontal sinus area.  No symptoms associated with headache. Hx of similar.  Motrin helps some but then headache comes back. No photophobia, or vomiting. Ambulatory, VSS.

## 2020-01-24 NOTE — ED Notes (Signed)
See triage note  Presents with h/a  States he developed frontal h/a this weekend  No fever ,n/v or fever  States did have some slight blurred vision

## 2020-01-24 NOTE — Discharge Instructions (Signed)
Your exam is overall normal and benign at this time.  You be treated for headache without any signs of serious injury.  Continue to monitor blood pressure and take over-the-counter Benadryl in addition to the prescription medicine for headache pain relief.  Follow-up with one of the community clinics for ongoing symptoms as well as routine medical care.

## 2020-01-24 NOTE — ED Provider Notes (Signed)
[facility] Emergency Department Provider Note ____________________________________________  Time seen: 0805  I have reviewed the triage vital signs and the nursing notes.  HISTORY  Chief Complaint  Headache  HPI Alexander Graham is a 34 y.o. male presents himself to the ED from work, for evaluation of a frontal headache with symptoms that began on Sunday.  Patient denies any trauma, fever, chills, or sweats.  He denies any modifying factors and alleviating factors.  He denies any history of chronic ongoing headaches, denies any visual disturbance, vertigo, tinnitus, or hearing loss.  He denies any sinus pressure, sinus drainage, or sore throat.  Patient has taken ibuprofen sporadically for symptom relief.  No alleviating measures today prior to arrival.  He denies any weakness, chest pain, or shortness of breath.  He denies any significant medical history and takes no daily medications.   History reviewed. No pertinent past medical history.  There are no problems to display for this patient.   History reviewed. No pertinent surgical history.  Prior to Admission medications   Medication Sig Start Date End Date Taking? Authorizing Provider  albuterol (VENTOLIN HFA) 108 (90 Base) MCG/ACT inhaler Inhale 2 puffs into the lungs every 6 (six) hours as needed for wheezing or shortness of breath. 11/14/19   Chesley Noon, MD  EPINEPHrine (EPIPEN 2-PAK) 0.3 mg/0.3 mL IJ SOAJ injection Inject 0.3 mLs (0.3 mg total) into the muscle once. 09/28/15   Governor Rooks, MD  metoCLOPramide (REGLAN) 5 MG tablet Take 1 tablet (5 mg total) by mouth every 8 (eight) hours as needed for up to 3 days for nausea or vomiting. 01/24/20 01/27/20  Evone Arseneau, Charlesetta Ivory, PA-C    Allergies Grass extracts [gramineae pollens]  History reviewed. No pertinent family history.  Social History Social History   Tobacco Use  . Smoking status: Current Every Day Smoker    Types: Cigarettes  .  Smokeless tobacco: Never Used  Substance Use Topics  . Alcohol use: Yes  . Drug use: No    Review of Systems  Constitutional: Negative for fever. Eyes: Negative for visual changes. ENT: Negative for sore throat. Cardiovascular: Negative for chest pain. Respiratory: Negative for shortness of breath. Gastrointestinal: Negative for abdominal pain, vomiting and diarrhea. Genitourinary: Negative for dysuria. Musculoskeletal: Negative for back pain. Skin: Negative for rash. Neurological: Negative for headaches.  Denies any focal weakness or numbness. ____________________________________________  PHYSICAL EXAM:  VITAL SIGNS: ED Triage Vitals  Enc Vitals Group     BP 01/24/20 0717 (!) 143/100     Pulse Rate 01/24/20 0717 79     Resp 01/24/20 0717 18     Temp 01/24/20 0719 98 F (36.7 C)     Temp Source 01/24/20 0717 Oral     SpO2 01/24/20 0717 97 %     Weight 01/24/20 0719 (!) 310 lb (140.6 kg)     Height --      Head Circumference --      Peak Flow --      Pain Score 01/24/20 0718 6     Pain Loc --      Pain Edu? --      Excl. in GC? --     Constitutional: Alert and oriented. Well appearing and in no distress. Head: Normocephalic and atraumatic. Eyes: Conjunctivae are normal. PERRL. Normal extraocular movements Neck: Supple.  Normal range of motion without crepitus.  No midline tenderness is noted.  No nuchal rigidity appreciated. Cardiovascular: Normal rate, regular rhythm. Normal distal pulses. Respiratory:  Normal respiratory effort. No wheezes/rales/rhonchi. Gastrointestinal: Soft and nontender. No distention. Musculoskeletal: Nontender with normal range of motion in all extremities.  Neurologic:  Normal gait without ataxia. Normal speech and language. No gross focal neurologic deficits are appreciated. Skin:  Skin is warm, dry and intact. No rash noted. Psychiatric: Mood and affect are normal. Patient exhibits appropriate insight and  judgment. ____________________________________________  PROCEDURES  Tylenol 650 mg PO Reglan 10 mg PO Naproxen 500 mg PO  Procedures ____________________________________________  INITIAL IMPRESSION / ASSESSMENT AND PLAN / ED COURSE  DDX: sinus headache, tension headache, cluster headache  Patient with ED evaluation of mild frontal headache pain over the last 2 to 3 days.  He denies any trauma, fever, chills, or sweats.  He also denies any sinus congestion, cough, chest pain.  Patient's exam is overall benign reassuring at this time.  No red flags or signs of acute intracranial process.  Will be treated with Tylenol or Reglan in the ED.  He is also encouraged to take Tylenol, ibuprofen, and Benadryl over-the-counter for additional headache pain relief.  He will follow-up with community clinic or return to the ED as needed.  Work note is provided as requested.  Alexander Graham was evaluated in Emergency Department on 01/24/2020 for the symptoms described in the history of present illness. He was evaluated in the context of the global COVID-19 pandemic, which necessitated consideration that the patient might be at risk for infection with the SARS-CoV-2 virus that causes COVID-19. Institutional protocols and algorithms that pertain to the evaluation of patients at risk for COVID-19 are in a state of rapid change based on information released by regulatory bodies including the CDC and federal and state organizations. These policies and algorithms were followed during the patient's care in the ED. ____________________________________________  FINAL CLINICAL IMPRESSION(S) / ED DIAGNOSES  Final diagnoses:  Acute nonintractable headache, unspecified headache type      Lissa Hoard, PA-C 01/24/20 9244    Jene Every, MD 01/24/20 [phone]

## 2020-07-09 ENCOUNTER — Other Ambulatory Visit: Payer: Self-pay

## 2020-07-09 ENCOUNTER — Emergency Department
Admission: EM | Admit: 2020-07-09 | Discharge: 2020-07-09 | Disposition: A | Discharge status: Home / Self Care | Payer: HRSA Program | Attending: Emergency Medicine | Admitting: Emergency Medicine

## 2020-07-09 DIAGNOSIS — F17210 Nicotine dependence, cigarettes, uncomplicated: Secondary | ICD-10-CM | POA: Insufficient documentation

## 2020-07-09 DIAGNOSIS — U071 COVID-19: Secondary | ICD-10-CM | POA: Insufficient documentation

## 2020-07-09 DIAGNOSIS — R509 Fever, unspecified: Secondary | ICD-10-CM | POA: Diagnosis present

## 2020-07-09 DIAGNOSIS — Z20822 Contact with and (suspected) exposure to covid-19: Secondary | ICD-10-CM

## 2020-07-09 LAB — SARS CORONAVIRUS 2 (TAT 6-24 HRS): SARS Coronavirus 2: POSITIVE — AB

## 2020-07-09 NOTE — ED Triage Notes (Signed)
Pt states cough and decreased appetite x 2 days, pt states recent covid exposure. Pt A&O x4, NAD noted at this time.

## 2020-07-09 NOTE — ED Provider Notes (Signed)
[facility] Emergency Department Provider Note  ____________________________________________   Event Date/Time   First MD Initiated Contact with Patient 07/09/20 1202     (approximate)  I have reviewed the triage vital signs and the nursing notes.   HISTORY  Chief Complaint URI    HPI Alexander Graham is a 35 y.o. male with no chronic medical issues who presents for evaluation  of COVID-like symptoms for the last few days, possibly as much is a week. He has had a cough but no trouble breathing and no chest pain.  Positive for subjective fever and chills.  He has had a decreased appetite, mildly sore throat but no difficulty swallowing, and some nausea but no vomiting.  He describes the symptoms as moderate in severity and nothing particular makes them better or worse.  He found out that he was at a training course at work last week and 5 or 6 of the 10 people involved have all tested positive for COVID-19.  He is unvaccinated.        No past medical history on file.  There are no problems to display for this patient.   No past surgical history on file.  Prior to Admission medications   Medication Sig Start Date End Date Taking? Authorizing Provider  albuterol (VENTOLIN HFA) 108 (90 Base) MCG/ACT inhaler Inhale 2 puffs into the lungs every 6 (six) hours as needed for wheezing or shortness of breath. 11/14/19   Chesley Noon, MD  EPINEPHrine (EPIPEN 2-PAK) 0.3 mg/0.3 mL IJ SOAJ injection Inject 0.3 mLs (0.3 mg total) into the muscle once. 09/28/15   Governor Rooks, MD  metoCLOPramide (REGLAN) 5 MG tablet Take 1 tablet (5 mg total) by mouth every 8 (eight) hours as needed for up to 3 days for nausea or vomiting. 01/24/20 01/27/20  Menshew, Charlesetta Ivory, PA-C    Allergies Grass extracts [gramineae pollens]  No family history on file.  Social History Social History   Tobacco Use  . Smoking status: Current Every Day Smoker    Types: Cigarettes  .  Smokeless tobacco: Never Used  Substance Use Topics  . Alcohol use: Yes  . Drug use: No    Review of Systems Constitutional: +fever/chills Eyes: No visual changes. ENT: +sore throat. Cardiovascular: Denies chest pain. Respiratory: Denies shortness of breath but positive for cough. Gastrointestinal: Decreased appetite, nausea, no abdominal pain. Genitourinary: Negative for dysuria. Musculoskeletal: Generalized body aches.  Negative for neck pain.  Negative for back pain. Integumentary: Negative for rash. Neurological: Negative for headaches, focal weakness or numbness.   ____________________________________________   PHYSICAL EXAM:  VITAL SIGNS: ED Triage Vitals  Enc Vitals Group     BP 07/09/20 1101 133/80     Pulse Rate 07/09/20 1101 84     Resp 07/09/20 1101 18     Temp 07/09/20 1101 98.6 F (37 C)     Temp Source 07/09/20 1101 Oral     SpO2 07/09/20 1101 97 %     Weight 07/09/20 1100 (!) 140 kg (308 lb 10.3 oz)     Height 07/09/20 1100 1.829 m (6')     Head Circumference --      Peak Flow --      Pain Score 07/09/20 1100 0     Pain Loc --      Pain Edu? --      Excl. in GC? --     Constitutional: Alert and oriented.  Eyes: Conjunctivae are normal.  Head: Atraumatic.  Nose: No congestion/rhinnorhea. Mouth/Throat: Patient is wearing a mask. Neck: No stridor.  No meningeal signs.   Cardiovascular: Normal rate, regular rhythm. Good peripheral circulation. Respiratory: Normal respiratory effort.  No retractions. Gastrointestinal: Soft and nontender. No distention.  Musculoskeletal: No lower extremity tenderness nor edema. No gross deformities of extremities. Neurologic:  Normal speech and language. No gross focal neurologic deficits are appreciated.  Skin:  Skin is warm, dry and intact. Psychiatric: Mood and affect are normal. Speech and behavior are normal.  ____________________________________________   LABS (all labs ordered are listed, but only abnormal  results are displayed)  Labs Reviewed  SARS CORONAVIRUS 2 (TAT 6-24 HRS)   ____________________________________________  EKG  No indication for emergent EKG ____________________________________________  RADIOLOGY I, Loleta Rose, personally viewed and evaluated these images (plain radiographs) as part of my medical decision making, as well as reviewing the written report by the radiologist.  ED MD interpretation: No indication for emergent imaging  Official radiology report(s): No results found.  ____________________________________________   PROCEDURES   Procedure(s) performed (including Critical Care):  Procedures   ____________________________________________   INITIAL IMPRESSION / MDM / ASSESSMENT AND PLAN / ED COURSE  As part of my medical decision making, I reviewed the following data within the electronic MEDICAL RECORD NUMBER Nursing notes reviewed and incorporated, Labs reviewed , Old chart reviewed and Notes from prior ED visits  Symptoms strongly suggest COVID-19.  Fortunately his vital signs are stable and within normal limits.  Patient agreed to COVID-19 swab and knows to check MyChart for the result.  I gave him my usual and customary management recommendations and return precautions and he understands and agrees with the plan.          ____________________________________________  FINAL CLINICAL IMPRESSION(S) / ED DIAGNOSES  Final diagnoses:  Suspected COVID-19 virus infection     MEDICATIONS GIVEN DURING THIS VISIT:  Medications - No data to display   ED Discharge Orders    None      *Please note:  Alexander Graham was evaluated in Emergency Department on 07/09/2020 for the symptoms described in the history of present illness. He was evaluated in the context of the global COVID-19 pandemic, which necessitated consideration that the patient might be at risk for infection with the SARS-CoV-2 virus that causes COVID-19. Institutional protocols and  algorithms that pertain to the evaluation of patients at risk for COVID-19 are in a state of rapid change based on information released by regulatory bodies including the CDC and federal and state organizations. These policies and algorithms were followed during the patient's care in the ED.  Some ED evaluations and interventions may be delayed as a result of limited staffing during and after the pandemic.*  Note:  This document was prepared using Dragon voice recognition software and may include unintentional dictation errors.   Loleta Rose, MD 07/09/20 1319

## 2020-07-09 NOTE — Discharge Instructions (Addendum)
As we discussed, we believe your symptoms are caused by a respiratory virus.  Stay hydrated with water, Gatorade, or other clear fluids, and use ibupropfen and/or acetaminophen (Tylenol) as needed for pain and fever (according to label instructions). ° °Because we cannot rule out the possibility of COVID-19 at this time, we sent a nasal swab test from the Emergency Department (ED).  Please read through the included information in this paperwork for information about how to set up a MyChart account so that you can log in over the next couple of days for your test results (including COVID-19 swab results if one was obtained during your Emergency Department visit). ° °Read through all the included information including the recommendations from the CDC.  If your test is positive, we recommend that you self-quarantine at home for about a week.  You should have as minimal contact as possible with anyone else including close family as per the CDC paperwork guidelines listed below. Follow-up with your doctor by phone or online as needed and return immediately to the emergency department or call 911 only if you develop new or worsening symptoms that concern you. ° °You can find up-to-date information about COVID-19 in [place] by calling the [facility] Coronavirus Helpline: 1-866-462-3821. You may also call 2-1-1, or 888-892-1162, or additional resources.  You can also find information online at https://www.ncdhhs.gov/divisions/public-health/coronavirus-disease-2019-covid-19-response-[place], or on the Center for Disease Control (CDC) website at https://www.cdc.gov/coronavirus/2019-ncov/index.html. ° °

## 2020-07-09 NOTE — ED Notes (Signed)
AAOx3.  Skin warm and dry.  NAD 

## 2020-07-17 ENCOUNTER — Emergency Department: Admission: EM | Admit: 2020-07-17 | Discharge: 2020-07-17 | Discharge status: Against Medical Advice | Payer: Self-pay

## 2020-07-23 ENCOUNTER — Encounter: Payer: Self-pay | Admitting: Emergency Medicine

## 2020-07-23 ENCOUNTER — Emergency Department
Admission: EM | Admit: 2020-07-23 | Discharge: 2020-07-23 | Disposition: A | Discharge status: Home / Self Care | Payer: HRSA Program | Attending: Student in an Organized Health Care Education/Training Program | Admitting: Student in an Organized Health Care Education/Training Program

## 2020-07-23 ENCOUNTER — Emergency Department: Payer: HRSA Program

## 2020-07-23 ENCOUNTER — Other Ambulatory Visit: Payer: Self-pay

## 2020-07-23 DIAGNOSIS — U071 COVID-19: Secondary | ICD-10-CM | POA: Insufficient documentation

## 2020-07-23 DIAGNOSIS — M791 Myalgia, unspecified site: Secondary | ICD-10-CM | POA: Diagnosis present

## 2020-07-23 DIAGNOSIS — F17210 Nicotine dependence, cigarettes, uncomplicated: Secondary | ICD-10-CM | POA: Insufficient documentation

## 2020-07-23 NOTE — Discharge Instructions (Addendum)
Follow-up with [facility] acute care if any continued problems.  Take Tylenol or ibuprofen as needed for body aches, headache or fever if needed.  Continue wearing your mask correctly which includes over your mouth and nose.  Today your chest x-ray does not show any signs of pneumonia.  Decrease or discontinue smoking as this will help your lungs greatly.  Consider getting the Covid vaccine.

## 2020-07-23 NOTE — ED Notes (Signed)
Patient verbalizes understanding of discharge instructions. Opportunity for questioning and answers were provided. Armband removed by staff, pt discharged from ED. Ambulated out to lobby  

## 2020-07-23 NOTE — ED Triage Notes (Signed)
Pt to ED via POV stating that he thinks that he has COVID again. Pt reports that he has generalized body aches, chills, and loss of appetite. Pt states that he tested positive on 07/09/20. Pt is in NAD.

## 2020-07-23 NOTE — ED Provider Notes (Signed)
[facility] Emergency Department Provider Note  ____________________________________________   Event Date/Time   First MD Initiated Contact with Patient 07/23/20 [id number]     (approximate)  I have reviewed the triage vital signs and the nursing notes.   HISTORY  Chief Complaint Chills and Generalized Body Aches   HPI Alexander Graham is a 35 y.o. male presents with complaint of generalized body aches, chills and loss of appetite.  Patient states that he tested positive for Covid on 07/09/2020.  Patient is nonvaccinated.  He continues to smoke 5 to 6 cigarettes/day.  He rates his pain as 7 out of 10.     History reviewed. No pertinent past medical history.  There are no problems to display for this patient.   History reviewed. No pertinent surgical history.  Prior to Admission medications   Medication Sig Start Date End Date Taking? Authorizing Provider  albuterol (VENTOLIN HFA) 108 (90 Base) MCG/ACT inhaler Inhale 2 puffs into the lungs every 6 (six) hours as needed for wheezing or shortness of breath. 11/14/19   Chesley Noon, MD  EPINEPHrine (EPIPEN 2-PAK) 0.3 mg/0.3 mL IJ SOAJ injection Inject 0.3 mLs (0.3 mg total) into the muscle once. 09/28/15   Governor Rooks, MD  metoCLOPramide (REGLAN) 5 MG tablet Take 1 tablet (5 mg total) by mouth every 8 (eight) hours as needed for up to 3 days for nausea or vomiting. 01/24/20 01/27/20  Menshew, Charlesetta Ivory, PA-C    Allergies Grass extracts [gramineae pollens]  No family history on file.  Social History Social History   Tobacco Use  . Smoking status: Current Every Day Smoker    Types: Cigarettes  . Smokeless tobacco: Never Used  Substance Use Topics  . Alcohol use: Yes  . Drug use: No    Review of Systems Constitutional: Subjective fever/chills Eyes: No visual changes. ENT: No sore throat. Cardiovascular: Denies chest pain. Respiratory: Denies shortness of breath.  Positive for  cough. Gastrointestinal: No abdominal pain.  No nausea, no vomiting.  No diarrhea.  Genitourinary: Negative for dysuria. Musculoskeletal: Positive for generalized body aches. Skin: Negative for rash. Neurological: Negative for headaches, focal weakness or numbness. ____________________________________________   PHYSICAL EXAM:  VITAL SIGNS: ED Triage Vitals [07/23/20 0720]  Enc Vitals Group     BP      Pulse      Resp      Temp      Temp src      SpO2      Weight (!) 317 lb (143.8 kg)     Height 6' (1.829 m)     Head Circumference      Peak Flow      Pain Score 7     Pain Loc      Pain Edu?      Excl. in GC?     Constitutional: Alert and oriented. Well appearing and in no acute distress. Eyes: Conjunctivae are normal.  Head: Atraumatic. Nose: No congestion/rhinnorhea. Neck: No stridor.   Cardiovascular: Normal rate, regular rhythm. Grossly normal heart sounds.  Good peripheral circulation. Respiratory: Normal respiratory effort.  No retractions. Lungs CTAB. Gastrointestinal: Soft and nontender. No distention.  Musculoskeletal: Moves upper and lower extremities without any difficulty.  Normal gait was noted. Neurologic:  Normal speech and language. No gross focal neurologic deficits are appreciated. No gait instability. Skin:  Skin is warm, dry and intact. No rash noted. Psychiatric: Mood and affect are normal. Speech and behavior are normal.  ____________________________________________  LABS (all labs ordered are listed, but only abnormal results are displayed)  Labs Reviewed - No data to display ____________________________________________   RADIOLOGY I, Tommi Rumps, personally viewed and evaluated these images (plain radiographs) as part of my medical decision making, as well as reviewing the written report by the radiologist.   Official radiology report(s): DG Chest Port 1 View  Result Date: 07/23/2020 CLINICAL DATA:  Fever.  Recent COVID-19 positive  EXAM: PORTABLE CHEST 1 VIEW COMPARISON:  Nov 17, 2019 FINDINGS: The lungs are clear. The heart size and pulmonary vascularity are normal. No adenopathy. No bone lesions. IMPRESSION: Lungs clear.  Cardiac silhouette normal. Electronically Signed   By: Bretta Bang III M.D.   On: 07/23/2020 08:09    ____________________________________________   PROCEDURES  Procedure(s) performed (including Critical Care):  Procedures   ____________________________________________   INITIAL IMPRESSION / ASSESSMENT AND PLAN / ED COURSE  As part of my medical decision making, I reviewed the following data within the electronic MEDICAL RECORD NUMBER Notes from prior ED visits and [place] Controlled Substance Database  35 year old male presents to the ED with complaint of generalized body aches, chills and loss of appetite.  Patient has history of testing positive for Covid on 07/09/2020.  Patient was made aware that he test at this time is not required as he may test positive for up to 90 days.  Chest x-ray was negative for pneumonia.  Patient was made aware that to continue Tylenol, ibuprofen as needed for body aches.  He is afebrile and an O2 sat of 98% is stable.  Patient is able to speak in complete sentences without any difficulty breathing.  He was also given instructions on how to wear his mask correctly.  Is to follow-up with [facility] if any continued problems and return to the emergency department if any severe worsening of his symptoms such as difficulty breathing or shortness of breath.  ____________________________________________   FINAL CLINICAL IMPRESSION(S) / ED DIAGNOSES  Final diagnoses:  COVID-19     ED Discharge Orders    None      *Please note:  Alexander Graham was evaluated in Emergency Department on 07/23/2020 for the symptoms described in the history of present illness. He was evaluated in the context of the global COVID-19 pandemic, which necessitated consideration that the  patient might be at risk for infection with the SARS-CoV-2 virus that causes COVID-19. Institutional protocols and algorithms that pertain to the evaluation of patients at risk for COVID-19 are in a state of rapid change based on information released by regulatory bodies including the CDC and federal and state organizations. These policies and algorithms were followed during the patient's care in the ED.  Some ED evaluations and interventions may be delayed as a result of limited staffing during and the pandemic.*   Note:  This document was prepared using Dragon voice recognition software and may include unintentional dictation errors.    Tommi Rumps, PA-C 07/23/20 6834    Willy Eddy, MD 07/23/20 1116

## 2020-12-09 IMAGING — CR DG CHEST 2V
2 series · 2 of 2 positions shown · non-contrast
Comparison: Three days ago

CLINICAL DATA: Chest tightness with shortness of breath

EXAM:
CHEST - 2 VIEW

[chest pa]
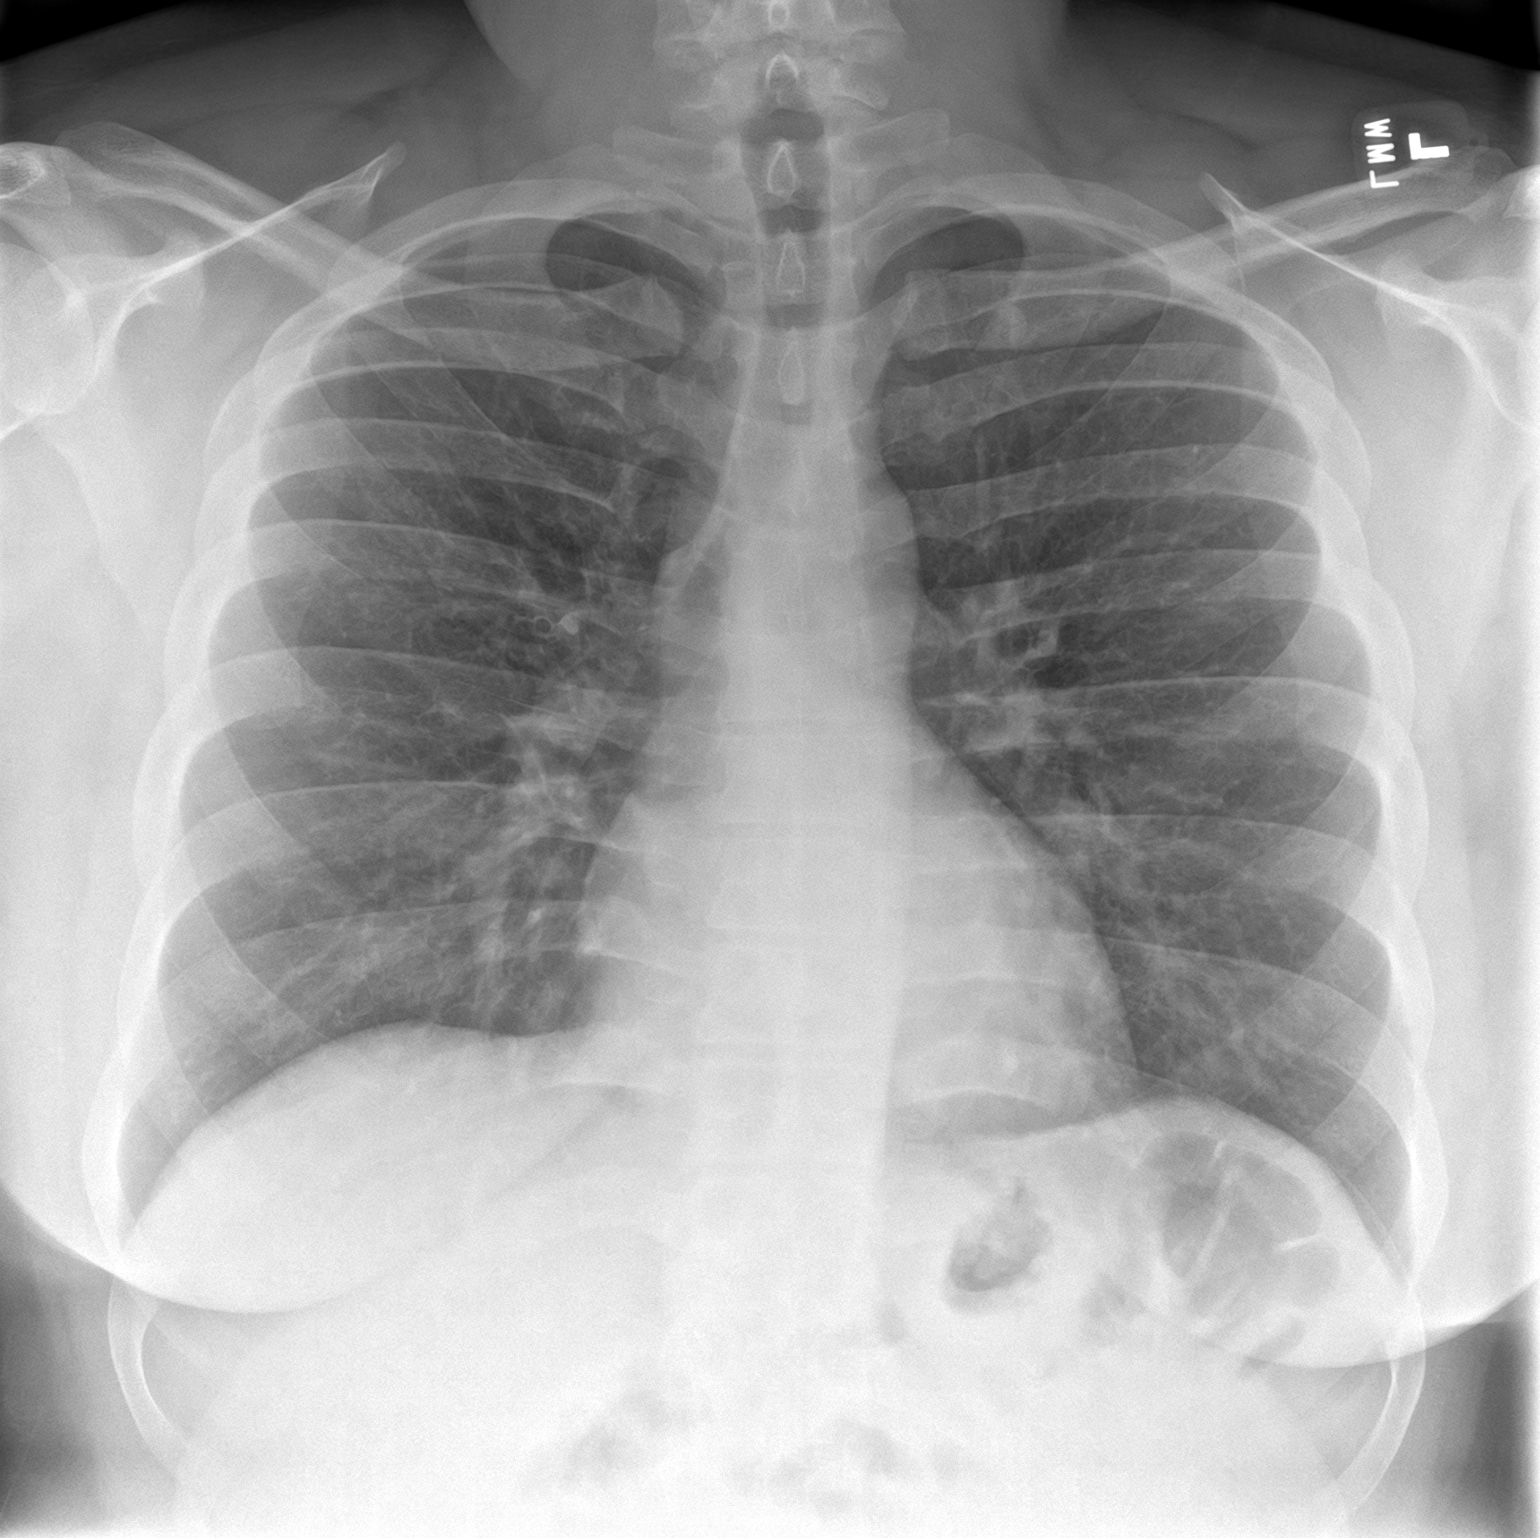

[chest lat]
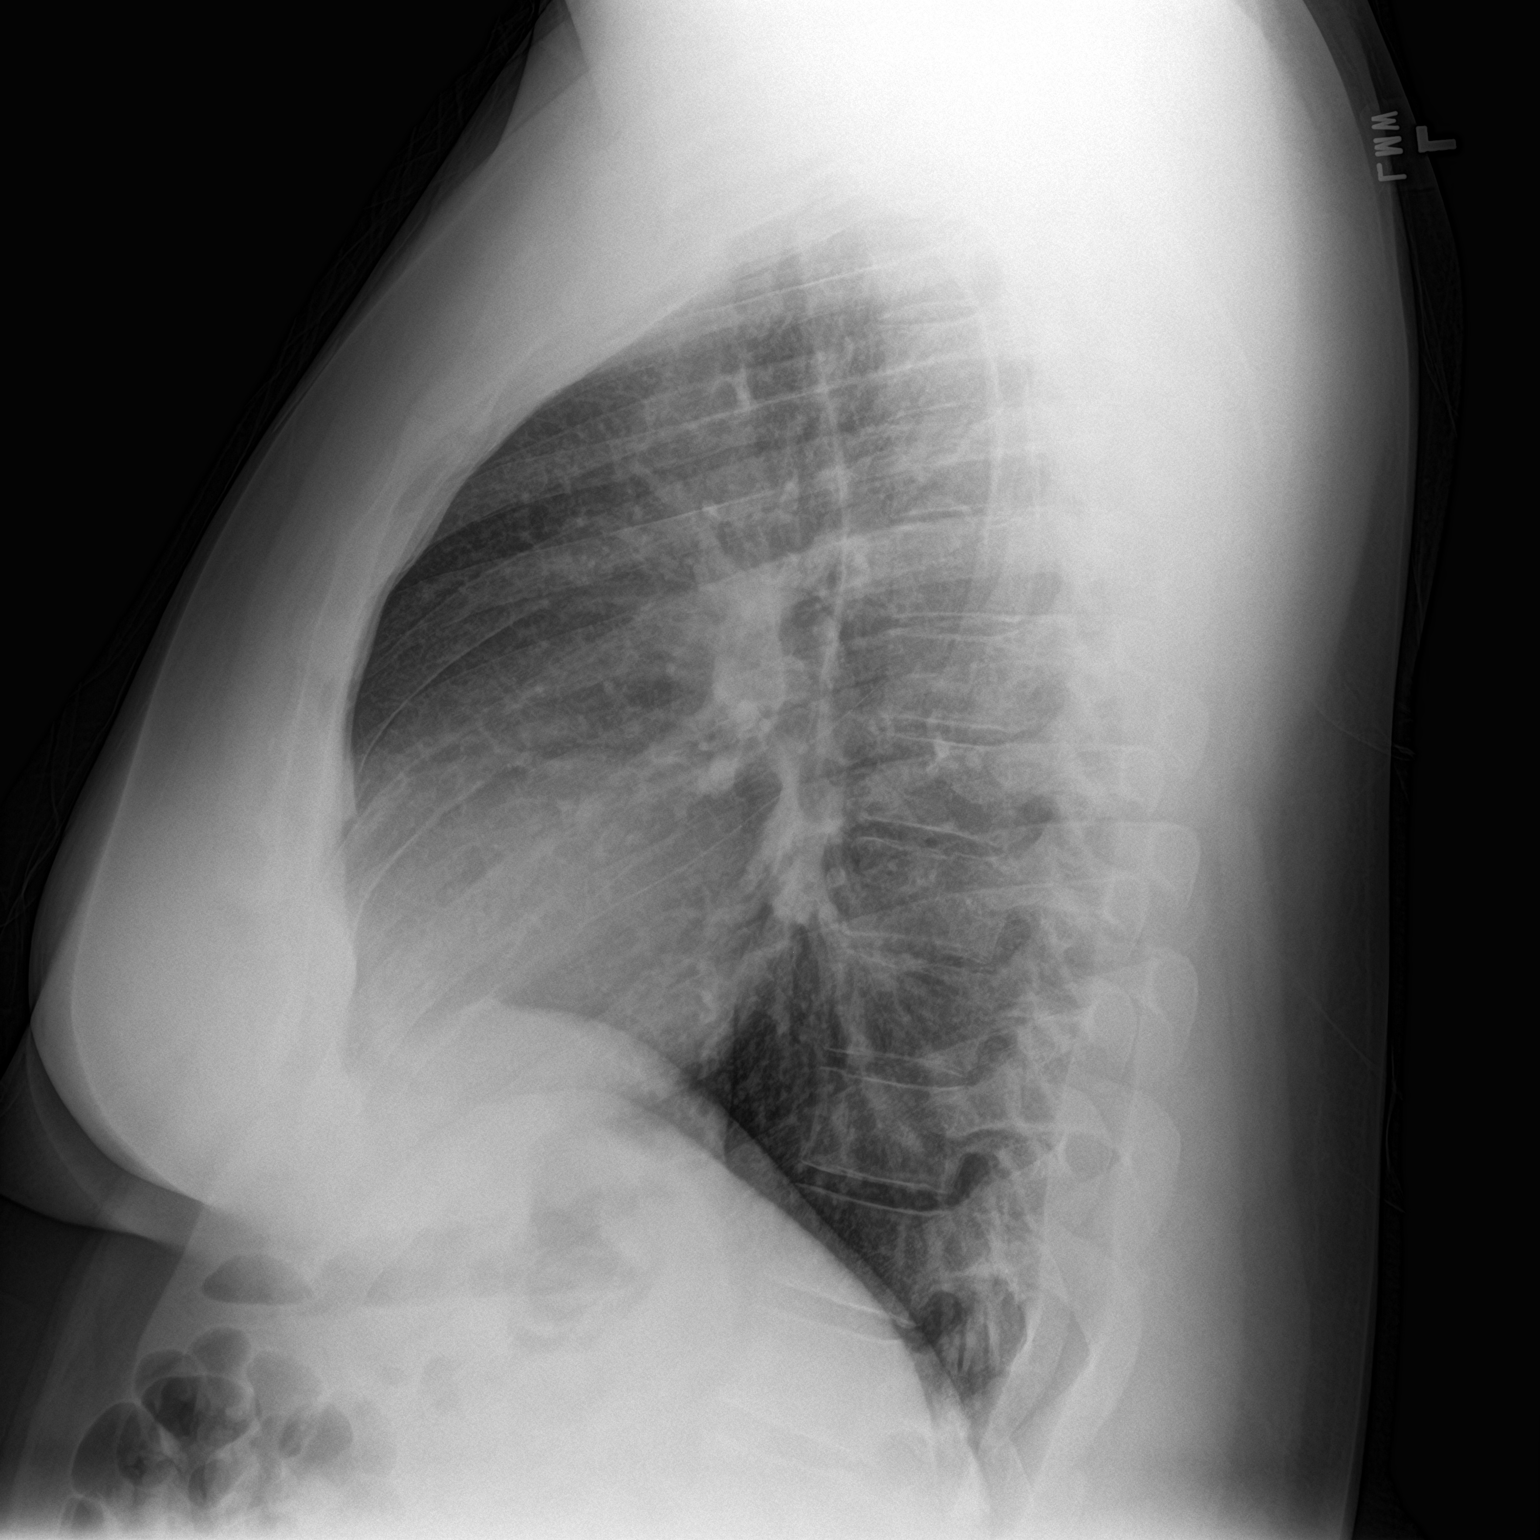

[2 of 2 positions shown; findings below may reference images not displayed]

FINDINGS: Normal heart size and mediastinal contours. No acute infiltrate or
edema. No effusion or pneumothorax. No acute osseous findings.
IMPRESSION: Negative chest.

## 2020-12-25 ENCOUNTER — Emergency Department
Admission: EM | Admit: 2020-12-25 | Discharge: 2020-12-25 | Disposition: A | Discharge status: Home / Self Care | Payer: Self-pay | Attending: Emergency Medicine | Admitting: Emergency Medicine

## 2020-12-25 ENCOUNTER — Other Ambulatory Visit: Payer: Self-pay

## 2020-12-25 DIAGNOSIS — F17210 Nicotine dependence, cigarettes, uncomplicated: Secondary | ICD-10-CM | POA: Insufficient documentation

## 2020-12-25 DIAGNOSIS — L03031 Cellulitis of right toe: Secondary | ICD-10-CM | POA: Insufficient documentation

## 2020-12-25 DIAGNOSIS — B353 Tinea pedis: Secondary | ICD-10-CM | POA: Insufficient documentation

## 2020-12-25 MED ORDER — TOLNAFTATE 1 % EX CREA: 1.0000 "application " | TOPICAL_CREAM | Freq: Two times a day (BID) | CUTANEOUS | 0 refills | Status: DC

## 2020-12-25 MED ORDER — SULFAMETHOXAZOLE-TRIMETHOPRIM 800-160 MG PO TABS: 1.0000 | ORAL_TABLET | Freq: Two times a day (BID) | ORAL | 0 refills | Status: DC

## 2020-12-25 NOTE — Discharge Instructions (Addendum)
Read and follow discharge care instruction.  Advised Epson salt soak for 10 minutes twice a day for 3 days.  Take medication as directed.

## 2020-12-25 NOTE — ED Provider Notes (Signed)
[facility] Emergency Department Provider Note   ____________________________________________   Event Date/Time   First MD Initiated Contact with Patient 12/25/20 1120     (approximate)  I have reviewed the triage vital signs and the nursing notes.   HISTORY  Chief Complaint Toe Pain    HPI Alexander Graham is a 35 y.o. male patient complain to pain to the fourth and fifth digit of the right foot for few days.  Patient wears steel toed shoes at work.  Patient states feet are always sweaty at the end of the workday.  Patient states he thinks he has sores or cuts on the bottom of his foot but due to body habitus unable to observe.  Rates pain as a 6/10.  Described pain as "sore".  No palliative measure for complaint.      History reviewed. No pertinent past medical history.  There are no problems to display for this patient.   History reviewed. No pertinent surgical history.  Prior to Admission medications   Medication Sig Start Date End Date Taking? Authorizing Provider  sulfamethoxazole-trimethoprim (BACTRIM DS) 800-160 MG tablet Take 1 tablet by mouth 2 (two) times daily. 12/25/20  Yes [name] Reining, PA-C  tolnaftate (TOLNAFTATE ANTIFUNGAL) 1 % cream Apply 1 application topically 2 (two) times daily. 12/25/20  Yes [name] Reining, PA-C  albuterol (VENTOLIN HFA) 108 (90 Base) MCG/ACT inhaler Inhale 2 puffs into the lungs every 6 (six) hours as needed for wheezing or shortness of breath. 11/14/19   Chesley Noon, MD  EPINEPHrine (EPIPEN 2-PAK) 0.3 mg/0.3 mL IJ SOAJ injection Inject 0.3 mLs (0.3 mg total) into the muscle once. 09/28/15   Governor Rooks, MD  metoCLOPramide (REGLAN) 5 MG tablet Take 1 tablet (5 mg total) by mouth every 8 (eight) hours as needed for up to 3 days for nausea or vomiting. 01/24/20 01/27/20  Menshew, Charlesetta Ivory, PA-C    Allergies Grass extracts [gramineae pollens]  No family history on file.  Social History Social History    Tobacco Use   Smoking status: Every Day    Pack years: 0.00    Types: Cigarettes   Smokeless tobacco: Never  Substance Use Topics   Alcohol use: Yes   Drug use: No    Review of Systems  Constitutional: No fever/chills Eyes: No visual changes. ENT: No sore throat. Cardiovascular: Denies chest pain. Respiratory: Denies shortness of breath. Gastrointestinal: No abdominal pain.  No nausea, no vomiting.  No diarrhea.  No constipation. Genitourinary: Negative for dysuria. Musculoskeletal: Negative for back pain. Skin: Positive for foot/toe rash. Neurological: Negative for headaches, focal weakness or numbness. Allergic/Immunilogical: Grass extract ____________________________________________   PHYSICAL EXAM:  VITAL SIGNS: ED Triage Vitals  Enc Vitals Group     BP 12/25/20 1051 (!) 147/110     Pulse Rate 12/25/20 1051 79     Resp 12/25/20 1051 17     Temp 12/25/20 1051 98.2 F (36.8 C)     Temp src --      SpO2 12/25/20 1051 100 %     Weight --      Height --      Head Circumference --      Peak Flow --      Pain Score 12/25/20 1050 6     Pain Loc --      Pain Edu? --      Excl. in GC? --     Constitutional: Alert and oriented. Well appearing and in no  acute distress. Eyes: Conjunctivae are normal. PERRL. EOMI. Head: Atraumatic. Nose: No congestion/rhinnorhea. Mouth/Throat: Mucous membranes are moist.  Oropharynx non-erythematous. Neck: No stridor.  No cervical spine tenderness to palpation. Hematological/Lymphatic/Immunilogical: No cervical lymphadenopathy. Cardiovascular: Normal rate, regular rhythm. Grossly normal heart sounds.  Good peripheral circulation.  Elevated blood pressure. Respiratory: Normal respiratory effort.  No retractions. Lungs CTAB. Gastrointestinal: Soft and nontender. No distention. No abdominal bruits. No CVA tenderness. Musculoskeletal: No lower extremity tenderness nor edema.  No joint effusions. Neurologic:  Normal speech and  language. No gross focal neurologic deficits are appreciated. No gait instability. Skin:  Skin is warm, dry and intact.  Patient has laceration to the plantar aspect and webspace of the fourth and fifth digit secondary to fungal infection. Psychiatric: Mood and affect are normal. Speech and behavior are normal.  ____________________________________________   LABS (all labs ordered are listed, but only abnormal results are displayed)  Labs Reviewed - No data to display ____________________________________________  EKG   ____________________________________________  RADIOLOGY I, [name] Reining, personally viewed and evaluated these images (plain radiographs) as part of my medical decision making, as well as reviewing the written report by the radiologist.  ED MD interpretation:    Official radiology report(s): No results found.  ____________________________________________   PROCEDURES  Procedure(s) performed (including Critical Care):  Procedures   ____________________________________________   INITIAL IMPRESSION / ASSESSMENT AND PLAN / ED COURSE  As part of my medical decision making, I reviewed the following data within the electronic MEDICAL RECORD NUMBER         Patient presents with pain and itching to the right fourth and fifth digit of the foot.  Patient complaint physical exam consistent with fungal infection with cellulitis to the plantar aspect of the fourth and fifth digit right foot.  Patient given discharge care instruction advised to take medication as directed.   ____________________________________________   FINAL CLINICAL IMPRESSION(S) / ED DIAGNOSES  Final diagnoses:  Tinea pedis of right foot  Cellulitis of toe of right foot     ED Discharge Orders          Ordered    sulfamethoxazole-trimethoprim (BACTRIM DS) 800-160 MG tablet  2 times daily        12/25/20 1227    tolnaftate (TOLNAFTATE ANTIFUNGAL) 1 % cream  2 times daily         12/25/20 1227             Note:  This document was prepared using Dragon voice recognition software and may include unintentional dictation errors.    [name] Reining, PA-C 12/25/20 1237    Merwyn Katos, MD 12/25/20 1540

## 2020-12-25 NOTE — ED Triage Notes (Signed)
Pt comes with c/o right toe pain for few days. Pt states it was worse yesterday. Pt states his last two toes.

## 2021-01-22 ENCOUNTER — Encounter: Payer: Self-pay | Admitting: Intensive Care

## 2021-01-22 ENCOUNTER — Emergency Department
Admission: EM | Admit: 2021-01-22 | Discharge: 2021-01-22 | Disposition: A | Discharge status: Home / Self Care | Payer: Self-pay | Attending: Emergency Medicine | Admitting: Emergency Medicine

## 2021-01-22 ENCOUNTER — Other Ambulatory Visit: Payer: Self-pay

## 2021-01-22 DIAGNOSIS — B349 Viral infection, unspecified: Secondary | ICD-10-CM

## 2021-01-22 DIAGNOSIS — Z1152 Encounter for screening for COVID-19: Secondary | ICD-10-CM

## 2021-01-22 DIAGNOSIS — F17210 Nicotine dependence, cigarettes, uncomplicated: Secondary | ICD-10-CM | POA: Insufficient documentation

## 2021-01-22 DIAGNOSIS — U071 COVID-19: Secondary | ICD-10-CM | POA: Insufficient documentation

## 2021-01-22 DIAGNOSIS — Z28310 Unvaccinated for covid-19: Secondary | ICD-10-CM | POA: Insufficient documentation

## 2021-01-22 LAB — RESP PANEL BY RT-PCR (FLU A&B, COVID) ARPGX2
Influenza A by PCR: NEGATIVE
Influenza B by PCR: NEGATIVE
SARS Coronavirus 2 by RT PCR: POSITIVE — AB

## 2021-01-22 NOTE — ED Notes (Signed)
See triage note  Presents with low grade fever and frontal headache   States sx's started on Friday  No cough or n/v

## 2021-01-22 NOTE — ED Provider Notes (Signed)
[facility] Emergency Department Provider Note   ____________________________________________   Event Date/Time   First MD Initiated Contact with Patient 01/22/21 1122     (approximate)  I have reviewed the triage vital signs and the nursing notes.   HISTORY  Chief Complaint Headache   HPI Alexander Graham is a 35 y.o. male presents to the ED with complaint of generalized headache for the last 4 days.  Patient denies any other symptoms of URI, nausea, vomiting, diarrhea, change in taste or smell.  Patient is unvaccinated for COVID.  He is unaware of any known COVID exposure.  He states that last evening his wife noticed that he was warm but he did not actually take his temperature.  He denies any other symptoms and there is no history of tick bites.  He rates his pain as 7 out of 10.       History reviewed. No pertinent past medical history.  There are no problems to display for this patient.   History reviewed. No pertinent surgical history.  Prior to Admission medications   Medication Sig Start Date End Date Taking? Authorizing Provider  albuterol (VENTOLIN HFA) 108 (90 Base) MCG/ACT inhaler Inhale 2 puffs into the lungs every 6 (six) hours as needed for wheezing or shortness of breath. 11/14/19   Chesley Noon, MD  EPINEPHrine (EPIPEN 2-PAK) 0.3 mg/0.3 mL IJ SOAJ injection Inject 0.3 mLs (0.3 mg total) into the muscle once. 09/28/15   Governor Rooks, MD  tolnaftate (TOLNAFTATE ANTIFUNGAL) 1 % cream Apply 1 application topically 2 (two) times daily. 12/25/20   [name] Reining, PA-C    Allergies Grass extracts [gramineae pollens]  History reviewed. No pertinent family history.  Social History Social History   Tobacco Use   Smoking status: Every Day    Types: Cigarettes   Smokeless tobacco: Never  Substance Use Topics   Alcohol use: Yes   Drug use: No    Review of Systems Constitutional: Positive fever/chills Eyes: No visual changes. ENT:  No sore throat.  Negative nasal congestion. Cardiovascular: Denies chest pain. Respiratory: Denies shortness of breath.  Negative for cough. Gastrointestinal: No abdominal pain.  No nausea, no vomiting.  No diarrhea.  Genitourinary: Negative for dysuria. Musculoskeletal: Negative for muscle aches. Skin: Negative for rash. Neurological: Positive for generalized headache.  Negative for focal weakness or numbness.  ____________________________________________   PHYSICAL EXAM:  VITAL SIGNS: ED Triage Vitals  Enc Vitals Group     BP 01/22/21 1104 (!) 137/98     Pulse Rate 01/22/21 1104 86     Resp 01/22/21 1104 18     Temp 01/22/21 1104 99.5 F (37.5 C)     Temp Source 01/22/21 1104 Oral     SpO2 01/22/21 1104 97 %     Weight 01/22/21 1105 (!) 317 lb (143.8 kg)     Height 01/22/21 1105 6' (1.829 m)     Head Circumference --      Peak Flow --      Pain Score 01/22/21 1105 7     Pain Loc --      Pain Edu? --      Excl. in GC? --     Constitutional: Alert and oriented. Well appearing and in no acute distress. Eyes: Conjunctivae are normal.  Head: Atraumatic. Nose: No congestion/rhinnorhea.  Ears: EACs and TMs are clear bilaterally. Neck: No stridor.   Cardiovascular: Normal rate, regular rhythm. Grossly normal heart sounds.  Good peripheral circulation. Respiratory: Normal  respiratory effort.  No retractions. Lungs CTAB. Gastrointestinal: Soft and nontender. No distention.  Musculoskeletal: Moves upper and lower extremities without any difficulty.  Normal gait was noted. Neurologic:  Normal speech and language.  Cranial nerves II through XII grossly intact.  No gross focal neurologic deficits are appreciated. No gait instability. Skin:  Skin is warm, dry and intact. No rash noted. Psychiatric: Mood and affect are normal. Speech and behavior are normal.  ____________________________________________   LABS (all labs ordered are listed, but only abnormal results are  displayed)  Labs Reviewed  RESP PANEL BY RT-PCR (FLU A&B, COVID) [id number]   ____________________________________________  PROCEDURES  Procedure(s) performed (including Critical Care):  Procedures   ____________________________________________   INITIAL IMPRESSION / ASSESSMENT AND PLAN / ED COURSE  As part of my medical decision making, I reviewed the following data within the electronic MEDICAL RECORD NUMBER Notes from prior ED visits and [place] Controlled Substance Database  35 year old male presents to the ED with complaint of headache for the last 4 days and possible fever last evening.  He has not taken his temperature at home but his wife reports that he felt warm last evening.  Patient is taken over-the-counter medication with little relief.  He denies any tick bites.  He is nonvaccinated for COVID.  He is unaware of any COVID exposure.  A respiratory swab was sent was given the option of staying or looking at MyChart for the results.  Patient reports that he will look on MyChart.  We discussed staying hydrated, Tylenol or ibuprofen for headache and fever.  He is aware that if his test is positive for COVID and develops any shortness of breath or difficulty breathing he is to return to the emergency department immediately. ____________________________________________   FINAL CLINICAL IMPRESSION(S) / ED DIAGNOSES  Final diagnoses:  Viral illness  Encounter for screening for COVID-19     ED Discharge Orders     None        Note:  This document was prepared using Dragon voice recognition software and may include unintentional dictation errors.    Tommi Rumps, PA-C 01/22/21 1241    Dionne Bucy, MD 01/22/21 [phone]

## 2021-01-22 NOTE — Discharge Instructions (Addendum)
Follow-up with your primary care provider or urgent care if any continued problems.  You may see the results of your COVID test on MyChart.  Be aware that if your test is positive you will need to quarantine for 10 days from the start of your symptoms.  Drink fluids to stay hydrated.  Tylenol or ibuprofen to help with headache, body aches and fever.  Also if you are positive inform people that have been around you so that they may be tested.  If your test is positive and you develop any shortness of breath or difficulty breathing return to the emergency department immediately.

## 2021-01-22 NOTE — ED Triage Notes (Signed)
C/o headache since Friday. Possible temperature at night. Reports otc medications with little relief. No hx headaches.

## 2021-08-15 IMAGING — DX DG CHEST 1V PORT
1 series · 1 of 1 positions shown · non-contrast
Comparison: November 17, 2019

CLINICAL DATA: Fever.  Recent A90AU-X5 positive

EXAM:
PORTABLE CHEST 1 VIEW

[chest ap]
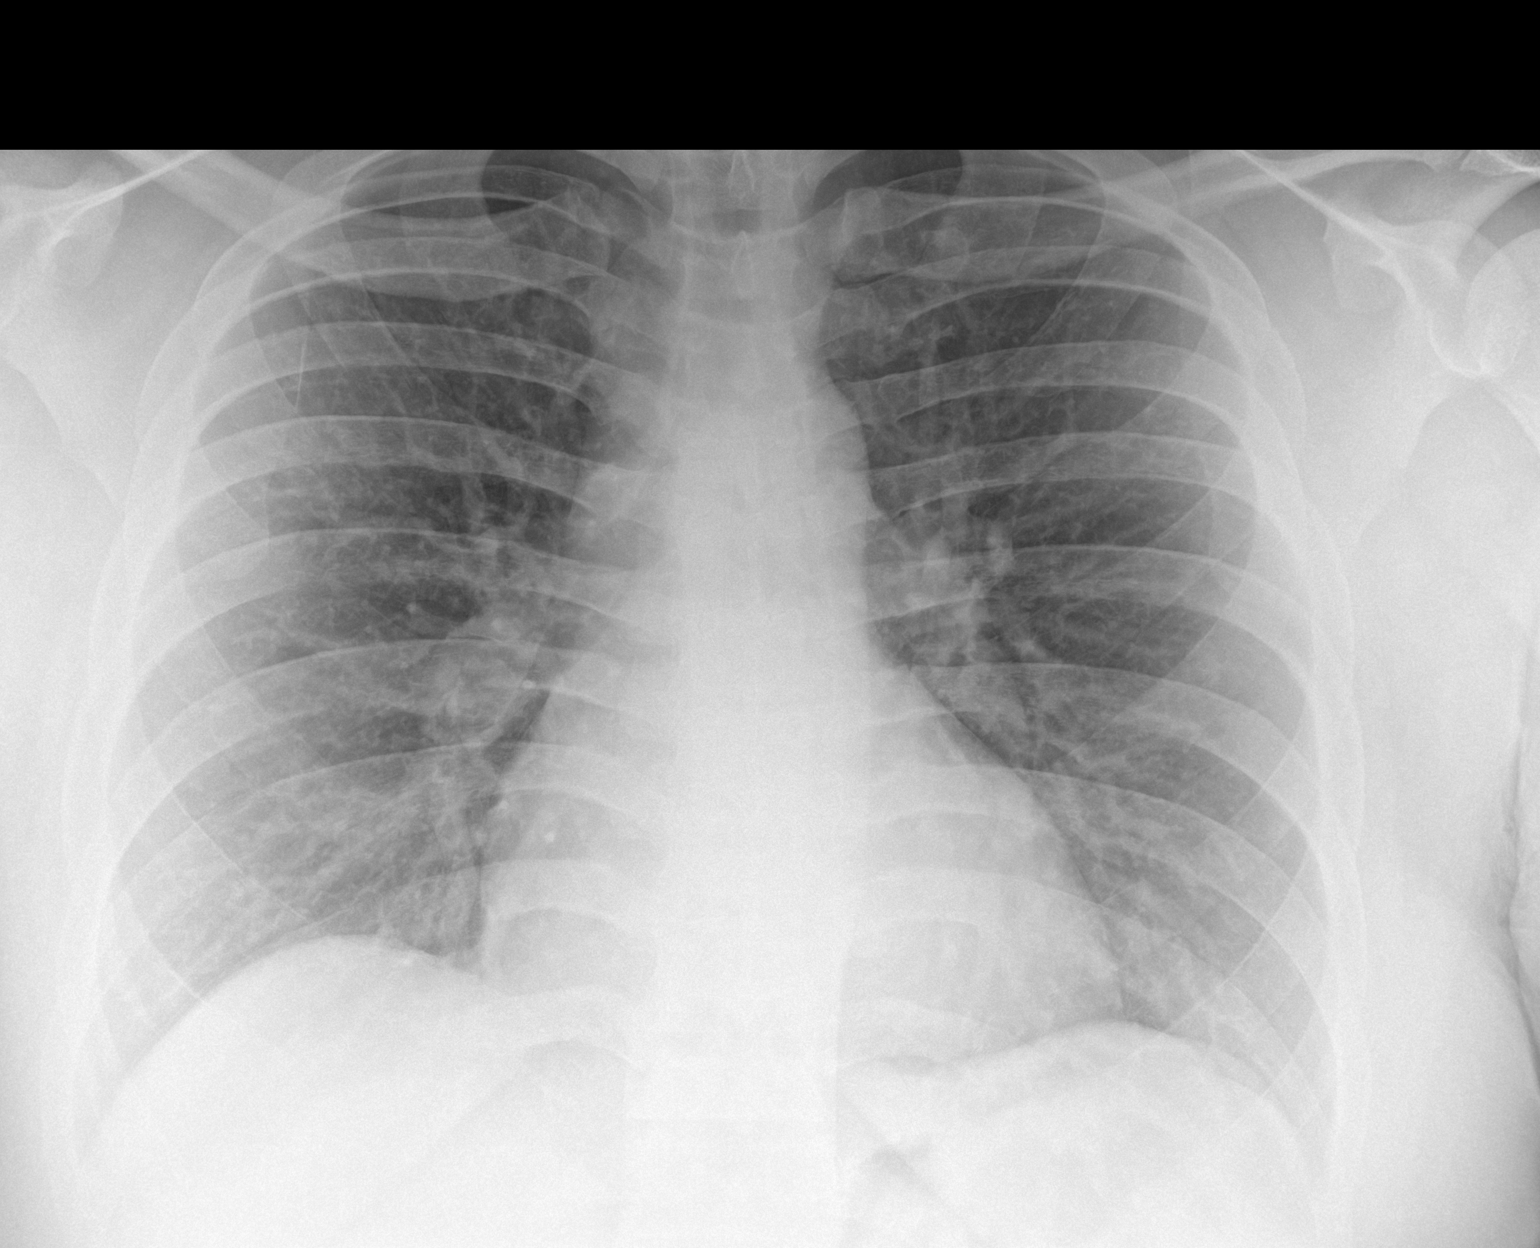

[1 of 1 positions shown; findings below may reference images not displayed]

FINDINGS: The lungs are clear. The heart size and pulmonary vascularity are
normal. No adenopathy. No bone lesions.
IMPRESSION: Lungs clear.  Cardiac silhouette normal.

## 2022-04-27 ENCOUNTER — Emergency Department
Admission: EM | Admit: 2022-04-27 | Discharge: 2022-04-27 | Disposition: A | Discharge status: Home / Self Care | Payer: Self-pay | Attending: Emergency Medicine | Admitting: Emergency Medicine

## 2022-04-27 ENCOUNTER — Encounter: Payer: Self-pay | Admitting: Emergency Medicine

## 2022-04-27 ENCOUNTER — Other Ambulatory Visit: Payer: Self-pay

## 2022-04-27 DIAGNOSIS — B353 Tinea pedis: Secondary | ICD-10-CM

## 2022-04-27 DIAGNOSIS — L304 Erythema intertrigo: Secondary | ICD-10-CM

## 2022-04-27 MED ORDER — CLOTRIMAZOLE-BETAMETHASONE 1-0.05 % EX CREA: 1.0000 | TOPICAL_CREAM | Freq: Two times a day (BID) | CUTANEOUS | 0 refills | Status: DC

## 2022-04-27 MED ORDER — NYSTATIN 100000 UNIT/GM EX POWD: 1.0000 | Freq: Three times a day (TID) | CUTANEOUS | 0 refills | Status: DC

## 2022-04-27 NOTE — Discharge Instructions (Signed)
You may apply the powder before you put on your shoes and socks. After your work day, wash really well with soap and water and apply the ointment. Air your feet out after work. Return for any new, worsening, or change in symptoms or other concerns.

## 2022-04-27 NOTE — ED Triage Notes (Signed)
Pt reports took his daughter outside to play and came back in and had a rash and burning to both feet.

## 2022-04-27 NOTE — ED Provider Notes (Signed)
[facility] Provider Note    Event Date/Time   First MD Initiated Contact with Patient 04/27/22 1526     (approximate)   History   Rash   HPI  Alexander Graham is a 36 y.o. male who presents today for evaluation of rash.  Patient reports that this rash has been present for many months.  He feels that it is worsening.  He reports that it is very itchy and worse between his toes.  He reports that he wears thick socks and has sweaty feet all day.  He reports that he is not diligent about washing his feet and airing them out at the end of a long workday.  He reports that he has difficulty visualizing the area because of his body habitus.  He also notes that he has a similar rash beneath bilateral breasts and sometimes under his belly.  No fevers or chills.  There are no problems to display for this patient.         Physical Exam   Triage Vital Signs: ED Triage Vitals  Enc Vitals Group     BP 04/27/22 1512 (!) 144/115     Pulse Rate 04/27/22 1512 85     Resp 04/27/22 1512 20     Temp 04/27/22 1512 98.3 F (36.8 C)     Temp Source 04/27/22 1512 Oral     SpO2 04/27/22 1512 95 %     Weight 04/27/22 1511 (!) 319 lb 10.7 oz (145 kg)     Height 04/27/22 1511 6' (1.829 m)     Head Circumference --      Peak Flow --      Pain Score 04/27/22 1511 9     Pain Loc --      Pain Edu? --      Excl. in [place]? --     Most recent vital signs: Vitals:   04/27/22 1512  BP: (!) 144/115  Pulse: 85  Resp: 20  Temp: 98.3 F (36.8 C)  SpO2: 95%    Physical Exam Vitals and nursing note reviewed.  Constitutional:      General: Awake and alert. No acute distress.    Appearance: Normal appearance. The patient is obese  HENT:     Head: Normocephalic and atraumatic.     Mouth: Mucous membranes are moist.  Eyes:     General: PERRL. Normal EOMs        Right eye: No discharge.        Left eye: No discharge.     Conjunctiva/sclera: Conjunctivae normal.   Cardiovascular:     Rate and Rhythm: Normal rate and regular rhythm.     Pulses: Normal pulses.  Pulmonary:     Effort: Pulmonary effort is normal. No respiratory distress.     Breath sounds: Normal breath sounds.  Abdominal:     Abdomen is soft. There is no abdominal tenderness. No rebound or guarding. No distention. Musculoskeletal:        General: No swelling. Normal range of motion.     Cervical back: Normal range of motion and neck supple.  Skin:    General: Skin is warm and dry.     Capillary Refill: Capillary refill takes less than 2 seconds.     Findings: Flakey rash between toes bilaterally, worse between toes 3-4 and 4-5 bilaterally. Hyperpigmented skin noted beneath bilateral breasts  Neurological:     Mental Status: The patient is awake and alert.  ED Results / Procedures / Treatments   Labs (all labs ordered are listed, but only abnormal results are displayed) Labs Reviewed - No data to display   EKG     RADIOLOGY     PROCEDURES:  Critical Care performed:   Procedures   MEDICATIONS ORDERED IN ED: Medications - No data to display   IMPRESSION / MDM / ASSESSMENT AND PLAN / ED COURSE  I reviewed the triage vital signs and the nursing notes.   Differential diagnosis includes, but is not limited to, tinea pedis, contact dermatitis, intertrigo, less likely cellulitis.  Patient is awake and alert, hemodynamically stable and afebrile.  His interdigital flaky skin, as well as the rash beneath his bilateral breasts are consistent with fungal infection.  I reviewed the patient's chart, patient has been diagnosed with tinea pedis in the past, though he does not remember this.  There is no redness, lymphangitis, swelling, fever, constitutional symptoms to suggest cellulitis.  No blistering or bullae noted.  No plantar findings.  He was started on nystatin powder to put in his feet before his work days where he he wears thick socks, and then instructed to wash  his feet thoroughly with soap and water at the end of the workday, dry them well, and then apply the clotrimazole.  He was instructed to let his feet air out.  We discussed return precautions and the importance of close outpatient follow-up.  Patient understands and agrees with plan.  He was discharged in stable condition.   Patient's presentation is most consistent with acute illness / injury with system symptoms.     FINAL CLINICAL IMPRESSION(S) / ED DIAGNOSES   Final diagnoses:  Tinea pedis of both feet  Intertrigo     Rx / DC Orders   ED Discharge Orders          Ordered    clotrimazole-betamethasone (LOTRISONE) cream  2 times daily        04/27/22 1536    nystatin (MYCOSTATIN/NYSTOP) powder  3 times daily        04/27/22 1536             Note:  This document was prepared using Dragon voice recognition software and may include unintentional dictation errors.   Jackelyn Hoehn, PA-C 04/27/22 1615    Chesley Noon, MD 04/27/22 1900

## 2023-01-02 ENCOUNTER — Observation Stay
Admission: EM | Admit: 2023-01-02 | Discharge: 2023-01-04 | DRG: 638 | Disposition: A | Discharge status: Home / Self Care | Payer: BLUE CROSS/BLUE SHIELD | Attending: Internal Medicine | Admitting: Internal Medicine

## 2023-01-02 ENCOUNTER — Other Ambulatory Visit: Payer: Self-pay

## 2023-01-02 DIAGNOSIS — F17210 Nicotine dependence, cigarettes, uncomplicated: Secondary | ICD-10-CM | POA: Diagnosis not present

## 2023-01-02 DIAGNOSIS — Z9109 Other allergy status, other than to drugs and biological substances: Secondary | ICD-10-CM

## 2023-01-02 DIAGNOSIS — Z7984 Long term (current) use of oral hypoglycemic drugs: Secondary | ICD-10-CM

## 2023-01-02 DIAGNOSIS — R739 Hyperglycemia, unspecified: Secondary | ICD-10-CM | POA: Insufficient documentation

## 2023-01-02 DIAGNOSIS — Z6841 Body Mass Index (BMI) 40.0 and over, adult: Secondary | ICD-10-CM

## 2023-01-02 DIAGNOSIS — N182 Chronic kidney disease, stage 2 (mild): Secondary | ICD-10-CM | POA: Insufficient documentation

## 2023-01-02 DIAGNOSIS — R631 Polydipsia: Secondary | ICD-10-CM | POA: Diagnosis not present

## 2023-01-02 DIAGNOSIS — R202 Paresthesia of skin: Secondary | ICD-10-CM | POA: Diagnosis present

## 2023-01-02 DIAGNOSIS — E1122 Type 2 diabetes mellitus with diabetic chronic kidney disease: Secondary | ICD-10-CM | POA: Diagnosis present

## 2023-01-02 DIAGNOSIS — R079 Chest pain, unspecified: Secondary | ICD-10-CM | POA: Diagnosis present

## 2023-01-02 DIAGNOSIS — H538 Other visual disturbances: Secondary | ICD-10-CM | POA: Diagnosis present

## 2023-01-02 DIAGNOSIS — E876 Hypokalemia: Secondary | ICD-10-CM | POA: Diagnosis present

## 2023-01-02 DIAGNOSIS — Z79899 Other long term (current) drug therapy: Secondary | ICD-10-CM

## 2023-01-02 DIAGNOSIS — R0602 Shortness of breath: Secondary | ICD-10-CM | POA: Diagnosis present

## 2023-01-02 DIAGNOSIS — R519 Headache, unspecified: Secondary | ICD-10-CM | POA: Diagnosis present

## 2023-01-02 DIAGNOSIS — R42 Dizziness and giddiness: Secondary | ICD-10-CM | POA: Diagnosis present

## 2023-01-02 DIAGNOSIS — E119 Type 2 diabetes mellitus without complications: Principal | ICD-10-CM | POA: Insufficient documentation

## 2023-01-02 DIAGNOSIS — Z833 Family history of diabetes mellitus: Secondary | ICD-10-CM

## 2023-01-02 DIAGNOSIS — F1410 Cocaine abuse, uncomplicated: Secondary | ICD-10-CM | POA: Diagnosis not present

## 2023-01-02 DIAGNOSIS — F1290 Cannabis use, unspecified, uncomplicated: Secondary | ICD-10-CM | POA: Diagnosis present

## 2023-01-02 DIAGNOSIS — E1169 Type 2 diabetes mellitus with other specified complication: Secondary | ICD-10-CM

## 2023-01-02 DIAGNOSIS — E1165 Type 2 diabetes mellitus with hyperglycemia: Principal | ICD-10-CM | POA: Diagnosis present

## 2023-01-02 DIAGNOSIS — R296 Repeated falls: Secondary | ICD-10-CM | POA: Diagnosis present

## 2023-01-02 DIAGNOSIS — Z716 Tobacco abuse counseling: Secondary | ICD-10-CM

## 2023-01-02 LAB — BASIC METABOLIC PANEL
Anion gap: 11 (ref 5–15)
BUN: 15 mg/dL (ref 6–20)
CO2: 25 mmol/L (ref 22–32)
Calcium: 9.9 mg/dL (ref 8.9–10.3)
Chloride: 97 mmol/L — ABNORMAL LOW (ref 98–111)
Creatinine, Ser: 1.31 mg/dL — ABNORMAL HIGH (ref 0.61–1.24)
GFR, Estimated: >60 mL/min (ref >60)
Glucose, Bld: 598 mg/dL (ref 70–99)
Potassium: 4.4 mmol/L (ref 3.5–5.1)
Sodium: 133 mmol/L — ABNORMAL LOW (ref 135–145)

## 2023-01-02 LAB — URINALYSIS, ROUTINE W REFLEX MICROSCOPIC
Bacteria, UA: NONE SEEN
Bilirubin Urine: NEGATIVE
Glucose, UA: >=500 mg/dL — AB
Hgb urine dipstick: NEGATIVE
Ketones, ur: NEGATIVE mg/dL
Leukocytes,Ua: NEGATIVE
Nitrite: NEGATIVE
Protein, ur: NEGATIVE mg/dL
Specific Gravity, Urine: 1.03 (ref 1.005–1.030)
pH: 5 (ref 5.0–8.0)

## 2023-01-02 LAB — URINE DRUG SCREEN, QUALITATIVE (ARMC ONLY)
Amphetamines, Ur Screen: NOT DETECTED
Barbiturates, Ur Screen: NOT DETECTED
Benzodiazepine, Ur Scrn: NOT DETECTED
Cannabinoid 50 Ng, Ur ~~LOC~~: POSITIVE — AB
Cocaine Metabolite,Ur ~~LOC~~: POSITIVE — AB
MDMA (Ecstasy)Ur Screen: NOT DETECTED
Methadone Scn, Ur: NOT DETECTED
Opiate, Ur Screen: NOT DETECTED
Phencyclidine (PCP) Ur S: NOT DETECTED
Tricyclic, Ur Screen: NOT DETECTED

## 2023-01-02 LAB — ETHANOL: Alcohol, Ethyl (B): <10 mg/dL (ref <10)

## 2023-01-02 LAB — CBG MONITORING, ED
Glucose-Capillary: >600 mg/dL (ref 70–99)
Glucose-Capillary: 462 mg/dL — ABNORMAL HIGH (ref 70–99)
Glucose-Capillary: 527 mg/dL (ref 70–99)
Glucose-Capillary: 384 mg/dL — ABNORMAL HIGH (ref 70–99)
Glucose-Capillary: 268 mg/dL — ABNORMAL HIGH (ref 70–99)

## 2023-01-02 LAB — CBC
HCT: 52.5 % — ABNORMAL HIGH (ref 39.0–52.0)
Hemoglobin: 17.3 g/dL — ABNORMAL HIGH (ref 13.0–17.0)
MCH: 27.6 pg (ref 26.0–34.0)
MCHC: 33 g/dL (ref 30.0–36.0)
MCV: 83.7 fL (ref 80.0–100.0)
Platelets: 226 10*3/uL (ref 150–400)
RBC: 6.27 MIL/uL — ABNORMAL HIGH (ref 4.22–5.81)
RDW: 12.9 % (ref 11.5–15.5)
WBC: 11.1 10*3/uL — ABNORMAL HIGH (ref 4.0–10.5)
nRBC: 0 % (ref 0.0–0.2)

## 2023-01-02 LAB — HEPATIC FUNCTION PANEL
ALT: 24 U/L (ref 0–44)
AST: 16 U/L (ref 15–41)
Albumin: 4.4 g/dL (ref 3.5–5.0)
Alkaline Phosphatase: 86 U/L (ref 38–126)
Bilirubin, Direct: 0.2 mg/dL (ref 0.0–0.2)
Indirect Bilirubin: 0.7 mg/dL (ref 0.3–0.9)
Total Bilirubin: 0.9 mg/dL (ref 0.3–1.2)
Total Protein: 8.9 g/dL — ABNORMAL HIGH (ref 6.5–8.1)

## 2023-01-02 LAB — GLUCOSE, CAPILLARY: Glucose-Capillary: 384 mg/dL — ABNORMAL HIGH (ref 70–99)

## 2023-01-02 MED ORDER — INSULIN ASPART 100 UNIT/ML IJ SOLN
0.0000 [IU] | INTRAMUSCULAR | Status: DC
  Administered 2023-01-02: 8 [IU] via SUBCUTANEOUS
  Administered 2023-01-03: 15 [IU] via SUBCUTANEOUS
  Administered 2023-01-03: 11 [IU] via SUBCUTANEOUS
  Administered 2023-01-03: 5 [IU] via SUBCUTANEOUS
  Administered 2023-01-03: 15 [IU] via SUBCUTANEOUS
  Administered 2023-01-03: 11 [IU] via SUBCUTANEOUS
  Administered 2023-01-03: 8 [IU] via SUBCUTANEOUS
  Administered 2023-01-04: 5 [IU] via SUBCUTANEOUS
  Administered 2023-01-04: 11 [IU] via SUBCUTANEOUS
  Administered 2023-01-04 (×2): 5 [IU] via SUBCUTANEOUS
  Filled 2023-01-02 (×11): qty 1

## 2023-01-02 MED ORDER — DEXTROSE IN LACTATED RINGERS 5 % IV SOLN: INTRAVENOUS | Status: DC

## 2023-01-02 MED ORDER — SODIUM CHLORIDE 0.9% FLUSH
3.0000 mL | Freq: Two times a day (BID) | INTRAVENOUS | Status: DC
  Administered 2023-01-02 – 2023-01-04 (×4): 3 mL via INTRAVENOUS

## 2023-01-02 MED ORDER — SODIUM CHLORIDE 0.9 % IV BOLUS
1000.0000 mL | Freq: Once | INTRAVENOUS | Status: AC
Start: 2023-01-02 — End: 2023-01-02
  Administered 2023-01-02: 1000 mL via INTRAVENOUS

## 2023-01-02 MED ORDER — NICOTINE 21 MG/24HR TD PT24
21.0000 mg | MEDICATED_PATCH | Freq: Every day | TRANSDERMAL | Status: DC
  Administered 2023-01-02 – 2023-01-03 (×2): 21 mg via TRANSDERMAL
  Filled 2023-01-02 (×3): qty 1

## 2023-01-02 MED ORDER — INSULIN ASPART 100 UNIT/ML IJ SOLN: 0.0000 [IU] | Freq: Three times a day (TID) | INTRAMUSCULAR | Status: DC

## 2023-01-02 MED ORDER — ACETAMINOPHEN 325 MG PO TABS: 650.0000 mg | ORAL_TABLET | Freq: Four times a day (QID) | ORAL | Status: DC | PRN

## 2023-01-02 MED ORDER — INSULIN REGULAR(HUMAN) IN NACL 100-0.9 UT/100ML-% IV SOLN
INTRAVENOUS | Status: DC
  Administered 2023-01-02: 18 [IU]/h via INTRAVENOUS
  Filled 2023-01-02: qty 100

## 2023-01-02 MED ORDER — INSULIN GLARGINE-YFGN 100 UNIT/ML ~~LOC~~ SOLN
10.0000 [IU] | Freq: Every day | SUBCUTANEOUS | Status: DC
  Administered 2023-01-02: 10 [IU] via SUBCUTANEOUS
  Filled 2023-01-02: qty 0.1

## 2023-01-02 MED ORDER — HEPARIN SODIUM (PORCINE) 5000 UNIT/ML IJ SOLN
5000.0000 [IU] | Freq: Three times a day (TID) | INTRAMUSCULAR | Status: DC
  Administered 2023-01-02 – 2023-01-04 (×5): 5000 [IU] via SUBCUTANEOUS
  Filled 2023-01-02 (×5): qty 1

## 2023-01-02 MED ORDER — LACTATED RINGERS IV SOLN: INTRAVENOUS | Status: DC

## 2023-01-02 MED ORDER — DEXTROSE 50 % IV SOLN: 0.0000 mL | INTRAVENOUS | Status: DC | PRN

## 2023-01-02 MED ORDER — ACETAMINOPHEN 650 MG RE SUPP: 650.0000 mg | Freq: Four times a day (QID) | RECTAL | Status: DC | PRN

## 2023-01-02 NOTE — ED Notes (Signed)
Critical lab result  Glucose 598 Dr. Marisa Severin notified [phone]

## 2023-01-02 NOTE — Assessment & Plan Note (Signed)
Suspect pt has mild- moderate degree of CKD as he is already having glucosuria. We will monitor and avoid renally cleared meds and renally dose them .

## 2023-01-02 NOTE — H&P (Signed)
History and Physical    Patient: Alexander Graham ZOX:[id number] DOB: [birth year] DOA: 01/02/2023 DOS: the patient was seen and examined on 01/02/2023 PCP: Pcp, No  Patient coming from: Home   Chief Complaint:  Chief Complaint  Patient presents with   Increased thirst    HPI: Alexander Graham is a 37 y.o. male with medical history significant for tobacco abuse and obesity comes for polyuria and polydipsia x few days . Headaches blurred vision paresthesias falls chest pain palpitation shortness of breath.  Patient works in [employer].  And have counseled him about choosing healthier options for meals.  Discussed with patient about his tobacco and complications of strokes heart disease heart attacks and kidney disease vascular complications and amputations.  Patient verbalizes understanding wife at bedside also agrees that they are going to be making some lifestyle changes. In the emergency room patient is alert awake oriented in no distress, afebrile. Initial BMP shows sodium 133 glucose 198 creatinine 1.31 previous creatinine is 1.20 in 2021, normal LFTs, CBC shows leukocytosis of 11.1 hemoglobin of 17.3 normal platelets and MCV. Urine drug screen is positive for cocaine and THC.  Review of Systems: Review of Systems  Genitourinary:  Positive for frequency.  Endo/Heme/Allergies:  Positive for polydipsia.   No past medical history on file. No past surgical history on file. Social History:  reports that he has been smoking cigarettes. He has never used smokeless tobacco. He reports current alcohol use. He reports that he does not use drugs.  Allergies  Allergen Reactions   Grass Extracts [Gramineae Pollens]     Only when wet    No family history on file.  Prior to Admission medications   Medication Sig Start Date End Date Taking? Authorizing Provider  albuterol (VENTOLIN HFA) 108 (90 Base) MCG/ACT inhaler Inhale 2 puffs into the lungs every 6 (six) hours as needed for wheezing or  shortness of breath. 11/14/19   Chesley Noon, MD  clotrimazole-betamethasone (LOTRISONE) cream Apply 1 Application topically 2 (two) times daily. 04/27/22   Poggi, Herb Grays, PA-C  EPINEPHrine (EPIPEN 2-PAK) 0.3 mg/0.3 mL IJ SOAJ injection Inject 0.3 mLs (0.3 mg total) into the muscle once. 09/28/15   Governor Rooks, MD  nystatin (MYCOSTATIN/NYSTOP) powder Apply 1 Application topically 3 (three) times daily. 04/27/22   Poggi, Eileen Stanford E, PA-C     Vitals:   01/02/23 1830 01/02/23 1900 01/02/23 2030 01/02/23 2045  BP: (!) 139/99 (!) 132/99 (!) 136/97   Pulse: 98 (!) 103  98  Resp: 17 16  18   Temp:      SpO2: 95% 97% 96% 95%   Physical Exam Vitals and nursing note reviewed.  Constitutional:      General: He is not in acute distress.    Appearance: He is obese.  HENT:     Head: Normocephalic and atraumatic.     Right Ear: Hearing normal.     Left Ear: Hearing normal.     Nose: Nose normal. No nasal deformity.     Mouth/Throat:     Lips: Pink.     Tongue: No lesions.     Pharynx: Oropharynx is clear.  Eyes:     General: Lids are normal.     Extraocular Movements: Extraocular movements intact.  Cardiovascular:     Rate and Rhythm: Normal rate and regular rhythm.     Heart sounds: Normal heart sounds.  Pulmonary:     Effort: Pulmonary effort is normal.     Breath sounds: Normal breath sounds.  Abdominal:     General: Bowel sounds are normal. There is no distension.     Palpations: Abdomen is soft. There is no mass.     Tenderness: There is no abdominal tenderness.  Musculoskeletal:     Right lower leg: No edema.     Left lower leg: No edema.  Skin:    General: Skin is warm.  Neurological:     General: No focal deficit present.     Mental Status: He is alert and oriented to person, place, and time.     Cranial Nerves: Cranial nerves 2-12 are intact.  Psychiatric:        Attention and Perception: Attention normal.        Mood and Affect: Mood normal.        Speech: Speech  normal.        Behavior: Behavior normal. Behavior is cooperative.      Labs on Admission: I have personally reviewed following labs and imaging studies  CBC: Recent Labs  Lab 01/02/23 1645  WBC 11.1*  HGB 17.3*  HCT 52.5*  MCV 83.7  PLT 226   Basic Metabolic Panel: Recent Labs  Lab 01/02/23 1645  NA 133*  K 4.4  CL 97*  CO2 25  GLUCOSE 598*  BUN 15  CREATININE 1.31*  CALCIUM 9.9   GFR: CrCl cannot be calculated (Unknown ideal weight.). Liver Function Tests: Recent Labs  Lab 01/02/23 1855  AST 16  ALT 24  ALKPHOS 86  BILITOT 0.9  PROT 8.9*  ALBUMIN 4.4   No results for input(s): "LIPASE", "AMYLASE" in the last 168 hours. No results for input(s): "AMMONIA" in the last 168 hours. Coagulation Profile: No results for input(s): "INR", "PROTIME" in the last 168 hours. Cardiac Enzymes: No results for input(s): "CKTOTAL", "CKMB", "CKMBINDEX", "TROPONINI" in the last 168 hours. BNP (last 3 results) No results for input(s): "PROBNP" in the last 8760 hours. HbA1C: No results for input(s): "HGBA1C" in the last 72 hours. CBG: Recent Labs  Lab 01/02/23 1645 01/02/23 1747 01/02/23 1829 01/02/23 1903 01/02/23 1958  GLUCAP >600* 527* 462* 384* 268*   Lipid Profile: No results for input(s): "CHOL", "HDL", "LDLCALC", "TRIG", "CHOLHDL", "LDLDIRECT" in the last 72 hours. Thyroid Function Tests: No results for input(s): "TSH", "T4TOTAL", "FREET4", "T3FREE", "THYROIDAB" in the last 72 hours. Anemia Panel: No results for input(s): "VITAMINB12", "FOLATE", "FERRITIN", "TIBC", "IRON", "RETICCTPCT" in the last 72 hours. Urine analysis: Urinalysis    Component Value Date/Time   COLORURINE STRAW (A) 01/02/2023 1645   APPEARANCEUR CLEAR (A) 01/02/2023 1645   APPEARANCEUR Clear 05/31/2014 2210   LABSPEC 1.030 01/02/2023 1645   LABSPEC 1.024 05/31/2014 2210   PHURINE 5.0 01/02/2023 1645   GLUCOSEU >=500 (A) 01/02/2023 1645   GLUCOSEU Negative 05/31/2014 2210   HGBUR  NEGATIVE 01/02/2023 1645   BILIRUBINUR NEGATIVE 01/02/2023 1645   BILIRUBINUR Negative 05/31/2014 2210   KETONESUR NEGATIVE 01/02/2023 1645   PROTEINUR NEGATIVE 01/02/2023 1645   NITRITE NEGATIVE 01/02/2023 1645   LEUKOCYTESUR NEGATIVE 01/02/2023 1645   LEUKOCYTESUR Negative 05/31/2014 2210   Radiological Exams on Admission: No results found.   Data Reviewed: Relevant notes from primary care and specialist visits, past discharge summaries as available in EHR, including Care Everywhere. Prior diagnostic testing as pertinent to current admission diagnoses Updated medications and problem lists for reconciliation ED course, including vitals, labs, imaging, treatment and response to treatment Triage notes, nursing and pharmacy notes and ED provider's notes Notable results as noted in HPI Assessment  and Plan: Cocaine abuse (HCC) Counseling once pt is stable and his hyperglycemia is resolved.     Tobacco abuse counseling Nicotine patch.  Counseled on tobacco cessation and risk for 5 minutes.   CKD (chronic kidney disease) stage 2, GFR 60-89 ml/min Suspect pt has mild- moderate degree of CKD as he is already having glucosuria. We will monitor and avoid renally cleared meds and renally dose them .   DM (diabetes mellitus), type 2 (HCC) New diabetes. Counseled pt on DM and lifestyle changes / diet changes and weight and exercise .  Pt verbalized understanding.  Encouraged for wt loss and prevent any future complication.  Pt received insulin gtt hyperglycemia protocol and will change to glycemic protocol.  If kidney function resolve we may do janumet and ozempic.  Baseline EKG ordered and pending.  AM team to determine d/c med based on A1c.    DVT prophylaxis:  Heparin   Consults:  None   Advance Care Planning:    Code Status: Full Code  Family Communication:  Wife at bedside.   Disposition Plan:  Back to previous home environment  Severity of Illness: The appropriate  patient status for this patient is OBSERVATION. Observation status is judged to be reasonable and necessary in order to provide the required intensity of service to ensure the patient's safety. The patient's presenting symptoms, physical exam findings, and initial radiographic and laboratory data in the context of their medical condition is felt to place them at decreased risk for further clinical deterioration. Furthermore, it is anticipated that the patient will be medically stable for discharge from the hospital within 2 midnights of admission.   Author: Gertha Calkin, MD 01/02/2023 9:03 PM  For on call review [email or web address].

## 2023-01-02 NOTE — Assessment & Plan Note (Signed)
Counseling once pt is stable and his hyperglycemia is resolved.

## 2023-01-02 NOTE — ED Triage Notes (Signed)
Pt comes with increased thirst and excessive urination for about 3 days. Pt denies any hx of diabetes. Pt states he feels tired too.

## 2023-01-02 NOTE — ED Provider Notes (Signed)
[facility] Provider Note    Event Date/Time   First MD Initiated Contact with Patient 01/02/23 1657     (approximate)   History   Increased thirst   HPI  Westyn Skousen is a 37 y.o. male with no known significant PMH who presents with increased thirst, polyuria, and generalized weakness which has been building gradually over several weeks, but acutely worsened in the last 3 days.  He also reports some lightheadedness.  The patient denies any fever or chills, vomiting or diarrhea, or acute pain.  He does have family history of diabetes but has not been diagnosed with diabetes or hyperglycemia himself.  I reviewed the past medical records.  The patient has had several prior ED visits over the last few years for unrelated symptoms.  His only other documented outpatient encounter was an outpatient visit in 2022 for a COVID test.   Physical Exam   Triage Vital Signs: ED Triage Vitals  Enc Vitals Group     BP 01/02/23 1650 (!) 143/99     Pulse Rate 01/02/23 1650 (!) 109     Resp 01/02/23 1650 19     Temp 01/02/23 1650 97.7 F (36.5 C)     Temp src --      SpO2 01/02/23 1650 100 %     Weight --      Height --      Head Circumference --      Peak Flow --      Pain Score 01/02/23 1644 0     Pain Loc --      Pain Edu? --      Excl. in [place]? --     Most recent vital signs: Vitals:   01/02/23 1830 01/02/23 1900  BP: (!) 139/99 (!) 132/99  Pulse: 98 (!) 103  Resp: 17 16  Temp:    SpO2: 95% 97%    General: Awake, no distress.  CV:  Good peripheral perfusion.  Resp:  Normal effort.  Abd:  No distention.  Other:  Dry mucous membranes.   ED Results / Procedures / Treatments   Labs (all labs ordered are listed, but only abnormal results are displayed) Labs Reviewed  BASIC METABOLIC PANEL - Abnormal; Notable for the following components:      Result Value   Sodium 133 (*)    Chloride 97 (*)    Glucose, Bld 598 (*)    Creatinine, Ser 1.31 (*)     All other components within normal limits  CBC - Abnormal; Notable for the following components:   WBC 11.1 (*)    RBC 6.27 (*)    Hemoglobin 17.3 (*)    HCT 52.5 (*)    All other components within normal limits  URINALYSIS, ROUTINE W REFLEX MICROSCOPIC - Abnormal; Notable for the following components:   Color, Urine STRAW (*)    APPearance CLEAR (*)    Glucose, UA >=500 (*)    All other components within normal limits  HEPATIC FUNCTION PANEL - Abnormal; Notable for the following components:   Total Protein 8.9 (*)    All other components within normal limits  CBG MONITORING, ED - Abnormal; Notable for the following components:   Glucose-Capillary >600 (*)    All other components within normal limits  CBG MONITORING, ED - Abnormal; Notable for the following components:   Glucose-Capillary 527 (*)    All other components within normal limits  CBG MONITORING, ED - Abnormal; Notable for the following  components:   Glucose-Capillary 462 (*)    All other components within normal limits  CBG MONITORING, ED - Abnormal; Notable for the following components:   Glucose-Capillary 384 (*)    All other components within normal limits  ETHANOL  URINE DRUG SCREEN, QUALITATIVE ([facility] ONLY)     EKG    RADIOLOGY    PROCEDURES:  Critical Care performed: No  Procedures   MEDICATIONS ORDERED IN ED: Medications  insulin regular, human (MYXREDLIN) 100 units/ 100 mL infusion (12 Units/hr Intravenous Rate/Dose Change 01/02/23 1906)  lactated ringers infusion ( Intravenous New Bag/Given 01/02/23 1750)  dextrose 5 % in lactated ringers infusion (has no administration in time range)  dextrose 50 % solution 0-50 mL (has no administration in time range)  sodium chloride 0.9 % bolus 1,000 mL (1,000 mLs Intravenous New Bag/Given 01/02/23 1653)     IMPRESSION / MDM / ASSESSMENT AND PLAN / ED COURSE  I reviewed the triage vital signs and the nursing notes.  37 year old male with PMH as noted  above presents with polydipsia, polyuria, lightheadedness.  Fingerstick is greater than 500.  Physical exam is unremarkable for acute findings other than dry mucous membranes.  Differential diagnosis includes, but is not limited to, new onset diabetes, DKA, HHS.  We will give fluids, obtain lab workup, and reassess.  Anticipate that the patient will need an insulin infusion and admission for further workup and treatment.  Patient's presentation is most consistent with acute presentation with potential threat to life or bodily function.  ----------------------------------------- 7:20 PM on 01/02/2023 -----------------------------------------  Lab workup confirms hyperglycemia at around 600, but the anion gap is normal.  Urinalysis does not show any ketones.  Overall presentation is consistent with uncomplicated hyperglycemia rather than DKA or HHS.  However, this represents likely new onset diabetes.  Given the relatively acute onset of the symptoms and the level of hyperglycemia, the patient will need inpatient admission for further workup and management.  I consulted Dr. Allena Katz from the hospitalist service; based on our discussion she agrees to evaluate the patient.  FINAL CLINICAL IMPRESSION(S) / ED DIAGNOSES   Final diagnoses:  Diabetes mellitus, new onset (HCC)  Hyperglycemia     Rx / DC Orders   ED Discharge Orders     None        Note:  This document was prepared using Dragon voice recognition software and may include unintentional dictation errors.    Dionne Bucy, MD 01/02/23 1921

## 2023-01-02 NOTE — Assessment & Plan Note (Addendum)
New diabetes. Counseled pt on DM and lifestyle changes / diet changes and weight and exercise .  Pt verbalized understanding.  Encouraged for wt loss and prevent any future complication.  Pt received insulin gtt hyperglycemia protocol and will change to glycemic protocol.  If kidney function resolve we may do janumet and ozempic.  Baseline EKG ordered and pending.  AM team to determine d/c med based on A1c.

## 2023-01-02 NOTE — Assessment & Plan Note (Signed)
Nicotine patch.  Counseled on tobacco cessation and risk for 5 minutes.

## 2023-01-03 DIAGNOSIS — F1290 Cannabis use, unspecified, uncomplicated: Secondary | ICD-10-CM | POA: Diagnosis present

## 2023-01-03 DIAGNOSIS — R202 Paresthesia of skin: Secondary | ICD-10-CM | POA: Diagnosis present

## 2023-01-03 DIAGNOSIS — F1410 Cocaine abuse, uncomplicated: Secondary | ICD-10-CM | POA: Diagnosis present

## 2023-01-03 DIAGNOSIS — R0602 Shortness of breath: Secondary | ICD-10-CM | POA: Diagnosis present

## 2023-01-03 DIAGNOSIS — R519 Headache, unspecified: Secondary | ICD-10-CM | POA: Diagnosis present

## 2023-01-03 DIAGNOSIS — R296 Repeated falls: Secondary | ICD-10-CM | POA: Diagnosis present

## 2023-01-03 DIAGNOSIS — N182 Chronic kidney disease, stage 2 (mild): Secondary | ICD-10-CM | POA: Diagnosis present

## 2023-01-03 DIAGNOSIS — Z833 Family history of diabetes mellitus: Secondary | ICD-10-CM | POA: Diagnosis not present

## 2023-01-03 DIAGNOSIS — Z9109 Other allergy status, other than to drugs and biological substances: Secondary | ICD-10-CM | POA: Diagnosis not present

## 2023-01-03 DIAGNOSIS — R079 Chest pain, unspecified: Secondary | ICD-10-CM | POA: Diagnosis present

## 2023-01-03 DIAGNOSIS — F17210 Nicotine dependence, cigarettes, uncomplicated: Secondary | ICD-10-CM | POA: Diagnosis present

## 2023-01-03 DIAGNOSIS — E119 Type 2 diabetes mellitus without complications: Secondary | ICD-10-CM

## 2023-01-03 DIAGNOSIS — F1490 Cocaine use, unspecified, uncomplicated: Secondary | ICD-10-CM

## 2023-01-03 DIAGNOSIS — E1169 Type 2 diabetes mellitus with other specified complication: Secondary | ICD-10-CM

## 2023-01-03 DIAGNOSIS — E1122 Type 2 diabetes mellitus with diabetic chronic kidney disease: Secondary | ICD-10-CM | POA: Diagnosis present

## 2023-01-03 DIAGNOSIS — E876 Hypokalemia: Secondary | ICD-10-CM | POA: Diagnosis present

## 2023-01-03 DIAGNOSIS — Z79899 Other long term (current) drug therapy: Secondary | ICD-10-CM | POA: Diagnosis not present

## 2023-01-03 DIAGNOSIS — Z7984 Long term (current) use of oral hypoglycemic drugs: Secondary | ICD-10-CM | POA: Diagnosis not present

## 2023-01-03 DIAGNOSIS — Z6841 Body Mass Index (BMI) 40.0 and over, adult: Secondary | ICD-10-CM | POA: Diagnosis not present

## 2023-01-03 DIAGNOSIS — E1165 Type 2 diabetes mellitus with hyperglycemia: Secondary | ICD-10-CM | POA: Diagnosis present

## 2023-01-03 DIAGNOSIS — Z716 Tobacco abuse counseling: Secondary | ICD-10-CM | POA: Diagnosis not present

## 2023-01-03 DIAGNOSIS — H538 Other visual disturbances: Secondary | ICD-10-CM | POA: Diagnosis present

## 2023-01-03 LAB — GLUCOSE, CAPILLARY
Glucose-Capillary: 328 mg/dL — ABNORMAL HIGH (ref 70–99)
Glucose-Capillary: 277 mg/dL — ABNORMAL HIGH (ref 70–99)
Glucose-Capillary: 354 mg/dL — ABNORMAL HIGH (ref 70–99)
Glucose-Capillary: 230 mg/dL — ABNORMAL HIGH (ref 70–99)
Glucose-Capillary: 325 mg/dL — ABNORMAL HIGH (ref 70–99)
Glucose-Capillary: 243 mg/dL — ABNORMAL HIGH (ref 70–99)

## 2023-01-03 LAB — CBC
HCT: 47 % (ref 39.0–52.0)
Hemoglobin: 15.3 g/dL (ref 13.0–17.0)
MCH: 27.5 pg (ref 26.0–34.0)
MCHC: 32.6 g/dL (ref 30.0–36.0)
MCV: 84.4 fL (ref 80.0–100.0)
Platelets: 186 10*3/uL (ref 150–400)
RBC: 5.57 MIL/uL (ref 4.22–5.81)
RDW: 12.9 % (ref 11.5–15.5)
WBC: 10.5 10*3/uL (ref 4.0–10.5)
nRBC: 0 % (ref 0.0–0.2)

## 2023-01-03 LAB — COMPREHENSIVE METABOLIC PANEL
ALT: 19 U/L (ref 0–44)
AST: 13 U/L — ABNORMAL LOW (ref 15–41)
Albumin: 3.7 g/dL (ref 3.5–5.0)
Alkaline Phosphatase: 64 U/L (ref 38–126)
Anion gap: 11 (ref 5–15)
BUN: 12 mg/dL (ref 6–20)
CO2: 24 mmol/L (ref 22–32)
Calcium: 9 mg/dL (ref 8.9–10.3)
Chloride: 100 mmol/L (ref 98–111)
Creatinine, Ser: 1.03 mg/dL (ref 0.61–1.24)
GFR, Estimated: >60 mL/min (ref >60)
Glucose, Bld: 316 mg/dL — ABNORMAL HIGH (ref 70–99)
Potassium: 3.2 mmol/L — ABNORMAL LOW (ref 3.5–5.1)
Sodium: 135 mmol/L (ref 135–145)
Total Bilirubin: 0.8 mg/dL (ref 0.3–1.2)
Total Protein: 7.5 g/dL (ref 6.5–8.1)

## 2023-01-03 LAB — HIV ANTIBODY (ROUTINE TESTING W REFLEX): HIV Screen 4th Generation wRfx: NONREACTIVE

## 2023-01-03 LAB — HEMOGLOBIN A1C
Hgb A1c MFr Bld: 10.6 % — ABNORMAL HIGH (ref 4.8–5.6)
Mean Plasma Glucose: 257.52 mg/dL

## 2023-01-03 MED ORDER — POTASSIUM CHLORIDE CRYS ER 20 MEQ PO TBCR
40.0000 meq | EXTENDED_RELEASE_TABLET | Freq: Once | ORAL | Status: AC
  Administered 2023-01-03: 40 meq via ORAL
  Filled 2023-01-03: qty 2

## 2023-01-03 MED ORDER — INSULIN GLARGINE-YFGN 100 UNIT/ML ~~LOC~~ SOLN
25.0000 [IU] | Freq: Every day | SUBCUTANEOUS | Status: DC
  Administered 2023-01-03: 25 [IU] via SUBCUTANEOUS
  Filled 2023-01-03 (×2): qty 0.25

## 2023-01-03 MED ORDER — LIVING WELL WITH DIABETES BOOK
Freq: Once | Status: DC
  Filled 2023-01-03: qty 1

## 2023-01-03 NOTE — Plan of Care (Signed)

## 2023-01-03 NOTE — Inpatient Diabetes Management (Signed)
Inpatient Diabetes Program Recommendations  AACE/ADA: New Consensus Statement on Inpatient Glycemic Control (2015)  Target Ranges:  Prepandial:   less than 140 mg/dL      Peak postprandial:   less than 180 mg/dL (1-2 hours)      Critically ill patients:  140 - 180 mg/dL   Lab Results  Component Value Date   GLUCAP 277 (H) 01/03/2023   HGBA1C 10.6 (H) 01/02/2023    Review of Glycemic Control  Latest Reference Range & Units 01/02/23 19:58 01/02/23 23:50 01/03/23 03:53 01/03/23 07:54  Glucose-Capillary 70 - 99 mg/dL 161 (H) 096 (H) 045 (H) 277 (H)   Diabetes history: New diagnosis of DM Outpatient Diabetes medications:  None Current orders for Inpatient glycemic control:  Novolog 0-15 units q 4 hours Semglee 10 units q HS  Inpatient Diabetes Program Recommendations:    Note new diagnosis of DM. Spoke with pt about new diagnosis.  Discussed A1C results with him and explained what an A1C is, basic pathophysiology of DM Type 2, basic home care, importance of checking CBGs and maintaining good CBG control to prevent long-term and short-term complications.  Reviewed signs and symptoms of hyperglycemia and hypoglycemia.  RNs to provide ongoing basic DM education at bedside with this patient.  Have ordered educational booklet and insulin starter kit.   Briefly reviewed by phone the use of insulin pen and sent patient video on how to use insulin pen (education attached to d/c summary as well).  Reviewed hypoglycemia signs, symptoms and treatment as well. Patient was able to teach back.  Asked bedside RN to continue education at bedside and allow patient to administer insulin with lunch and also show him how to check blood sugars.  Patient verbalized understanding.  Encouraged f/u with PCP ASAP and reviewed goal blood sugars of 100-180 mg/dL.    -Consider increasing Semglee to 25 units.  Please see D/C recommendations in "Adult Diabetes Insulin Treatment post-discharge" orders.   Discharge  Recommendations: Other recommendations: Metformin 500 mg bid Long acting recommendations: Insulin Glargine (LANTUS) Solostar Pen 25 units q bedtime  Supply/Referral recommendations: Glucometer Test strips Lancet device Lancets Pen needles - standard Referral to Nutrition [facility] Diab Services   Thanks,  Beryl Meager, RN, BC-ADM Inpatient Diabetes Coordinator Pager [phone]  (8a-5p)

## 2023-01-03 NOTE — TOC CM/SW Note (Signed)
CSW met with patient beside to explain [facility], and hours of operation. Patient was given info regarding employee pharmacy. CSW also explained diabetic kit and had patient sign donation form.  Alexander Graham, LCSWA TOC-Weekends [phone]

## 2023-01-03 NOTE — Plan of Care (Signed)
  Problem: Education: Goal: Ability to describe self-care measures that may prevent or decrease complications (Diabetes Survival Skills Education) will improve Outcome: Progressing Goal: Individualized Educational Video(s) Outcome: Progressing   Problem: Coping: Goal: Ability to adjust to condition or change in health will improve Outcome: Progressing   Problem: Fluid Volume: Goal: Ability to maintain a balanced intake and output will improve Outcome: Progressing   Problem: Health Behavior/Discharge Planning: Goal: Ability to identify and utilize available resources and services will improve Outcome: Progressing Goal: Ability to manage health-related needs will improve Outcome: Progressing   Problem: Metabolic: Goal: Ability to maintain appropriate glucose levels will improve Outcome: Progressing   Problem: Nutritional: Goal: Maintenance of adequate nutrition will improve Outcome: Progressing Goal: Progress toward achieving an optimal weight will improve Outcome: Progressing   Problem: Tissue Perfusion: Goal: Adequacy of tissue perfusion will improve Outcome: Progressing   Problem: Education: Goal: Knowledge of General Education information will improve Description: Including pain rating scale, medication(s)/side effects and non-pharmacologic comfort measures Outcome: Progressing   Problem: Health Behavior/Discharge Planning: Goal: Ability to manage health-related needs will improve Outcome: Progressing   Problem: Clinical Measurements: Goal: Ability to maintain clinical measurements within normal limits will improve Outcome: Progressing Goal: Will remain free from infection Outcome: Progressing Goal: Diagnostic test results will improve Outcome: Progressing Goal: Respiratory complications will improve Outcome: Progressing Goal: Cardiovascular complication will be avoided Outcome: Progressing   Problem: Activity: Goal: Risk for activity intolerance will  decrease Outcome: Progressing   Problem: Nutrition: Goal: Adequate nutrition will be maintained Outcome: Progressing   Problem: Coping: Goal: Level of anxiety will decrease Outcome: Progressing   Problem: Elimination: Goal: Will not experience complications related to bowel motility Outcome: Progressing Goal: Will not experience complications related to urinary retention Outcome: Progressing   Problem: Pain Managment: Goal: General experience of comfort will improve Outcome: Progressing   Problem: Safety: Goal: Ability to remain free from injury will improve Outcome: Progressing   Problem: Skin Integrity: Goal: Risk for impaired skin integrity will decrease Outcome: Progressing   

## 2023-01-03 NOTE — Discharge Instructions (Signed)

## 2023-01-03 NOTE — Progress Notes (Addendum)
PROGRESS NOTE    Alexander Graham  WGN:[id number] DOB: December 14, 1985 DOA: 01/02/2023 PCP: Pcp, No   Assessment & Plan:   Active Problems:   DM (diabetes mellitus), type 2 (HCC)   CKD (chronic kidney disease) stage 2, GFR 60-89 ml/min   Tobacco abuse counseling   Cocaine abuse (HCC)  Assessment and Plan: DM2: new onset, HbA1c 10.6. Continue on glargine, SSI w/ accuchecks. Received DM education. DM coordinator consulted  Cocaine abuse: urine drug screen was positive for cocaine, marijuana. Illicit drug use cessation counseling   Smoker: nicotine patch to prevent w/drawal. Received smoking cessation counseling already  Hypokalemia: potassium given   CKDII: Cr is labile. Avoid nephrotoxic meds  Morbid obesity: BMI 45.3. Complicates overall care & prognosis    DVT prophylaxis: heparin SQ Code Status: full Family Communication: Disposition Plan: likely d/c back home   Level of care: Med-Surg Status is: Observation The patient remains OBS appropriate and will d/c before 2 midnights.    Consultants:    Procedures:   Antimicrobials:    Subjective: Pt c/o malaise   Objective: Vitals:   01/02/23 2140 01/02/23 2200 01/03/23 0351 01/03/23 0755  BP: (!) 147/109  (!) 140/97 125/89  Pulse: 100  92 83  Resp: 15  16 18   Temp: 98.9 F (37.2 C)  97.9 F (36.6 C) 98 F (36.7 C)  TempSrc: Oral  Oral   SpO2: 97%  96% 97%  Weight:  (!) 155.9 kg    Height:  6\' 1"  (1.854 m)      Intake/Output Summary (Last 24 hours) at 01/03/2023 0815 Last data filed at 01/03/2023 0425 Gross per 24 hour  Intake 1522.35 ml  Output --  Net 1522.35 ml   Filed Weights   01/02/23 2200  Weight: (!) 155.9 kg    Examination:  General exam: Appears calm and comfortable  Respiratory system: Clear to auscultation. Respiratory effort normal. Cardiovascular system: S1 & S2+. No rubs, gallops or clicks.  Gastrointestinal system: Abdomen is nondistended, soft and nontender. Normal bowel sounds  heard. Central nervous system: Alert and oriented. Moves all extremities  Psychiatry: Judgement and insight appear normal. Mood & affect appropriate.     Data Reviewed: I have personally reviewed following labs and imaging studies  CBC: Recent Labs  Lab 01/02/23 1645 01/03/23 0511  WBC 11.1* 10.5  HGB 17.3* 15.3  HCT 52.5* 47.0  MCV 83.7 84.4  PLT 226 186   Basic Metabolic Panel: Recent Labs  Lab 01/02/23 1645 01/03/23 0511  NA 133* 135  K 4.4 3.2*  CL 97* 100  CO2 25 24  GLUCOSE 598* 316*  BUN 15 12  CREATININE 1.31* 1.03  CALCIUM 9.9 9.0   GFR: Estimated Creatinine Clearance: 156.2 mL/min (by C-G formula based on SCr of 1.03 mg/dL). Liver Function Tests: Recent Labs  Lab 01/02/23 1855 01/03/23 0511  AST 16 13*  ALT 24 19  ALKPHOS 86 64  BILITOT 0.9 0.8  PROT 8.9* 7.5  ALBUMIN 4.4 3.7   No results for input(s): "LIPASE", "AMYLASE" in the last 168 hours. No results for input(s): "AMMONIA" in the last 168 hours. Coagulation Profile: No results for input(s): "INR", "PROTIME" in the last 168 hours. Cardiac Enzymes: No results for input(s): "CKTOTAL", "CKMB", "CKMBINDEX", "TROPONINI" in the last 168 hours. BNP (last 3 results) No results for input(s): "PROBNP" in the last 8760 hours. HbA1C: Recent Labs    01/02/23 1645  HGBA1C 10.6*   CBG: Recent Labs  Lab 01/02/23 1903 01/02/23 1958 01/02/23  2350 01/03/23 0353 01/03/23 0754  GLUCAP 384* 268* 384* 328* 277*   Lipid Profile: No results for input(s): "CHOL", "HDL", "LDLCALC", "TRIG", "CHOLHDL", "LDLDIRECT" in the last 72 hours. Thyroid Function Tests: No results for input(s): "TSH", "T4TOTAL", "FREET4", "T3FREE", "THYROIDAB" in the last 72 hours. Anemia Panel: No results for input(s): "VITAMINB12", "FOLATE", "FERRITIN", "TIBC", "IRON", "RETICCTPCT" in the last 72 hours. Sepsis Labs: No results for input(s): "PROCALCITON", "LATICACIDVEN" in the last 168 hours.  No results found for this or any  previous visit (from the past 240 hour(s)).       Radiology Studies: No results found.      Scheduled Meds:  heparin  5,000 Units Subcutaneous Q8H   insulin aspart  0-15 Units Subcutaneous Q4H   insulin glargine-yfgn  10 Units Subcutaneous QHS   nicotine  21 mg Transdermal Daily   sodium chloride flush  3 mL Intravenous Q12H   Continuous Infusions:  dextrose 5% lactated ringers     lactated ringers 125 mL/hr at 01/03/23 0425     LOS: 0 days    Time spent: 35 mins     Charise Killian, MD [facility] Pager [phone]  If 7PM-7AM, please contact night-coverage [email or web address] 01/03/2023, 8:15 AM

## 2023-01-04 DIAGNOSIS — E1169 Type 2 diabetes mellitus with other specified complication: Secondary | ICD-10-CM | POA: Diagnosis not present

## 2023-01-04 LAB — BASIC METABOLIC PANEL
Anion gap: 9 (ref 5–15)
BUN: 12 mg/dL (ref 6–20)
CO2: 26 mmol/L (ref 22–32)
Calcium: 8.7 mg/dL — ABNORMAL LOW (ref 8.9–10.3)
Chloride: 100 mmol/L (ref 98–111)
Creatinine, Ser: 0.87 mg/dL (ref 0.61–1.24)
GFR, Estimated: >60 mL/min (ref >60)
Glucose, Bld: 235 mg/dL — ABNORMAL HIGH (ref 70–99)
Potassium: 3.3 mmol/L — ABNORMAL LOW (ref 3.5–5.1)
Sodium: 135 mmol/L (ref 135–145)

## 2023-01-04 LAB — MAGNESIUM: Magnesium: 1.8 mg/dL (ref 1.7–2.4)

## 2023-01-04 LAB — GLUCOSE, CAPILLARY
Glucose-Capillary: 234 mg/dL — ABNORMAL HIGH (ref 70–99)
Glucose-Capillary: 210 mg/dL — ABNORMAL HIGH (ref 70–99)
Glucose-Capillary: 343 mg/dL — ABNORMAL HIGH (ref 70–99)

## 2023-01-04 MED ORDER — INSULIN PEN NEEDLE 31G X 8 MM MISC: 0 refills | Status: DC

## 2023-01-04 MED ORDER — METFORMIN HCL ER (OSM) 500 MG PO TB24: 500.0000 mg | ORAL_TABLET | Freq: Two times a day (BID) | ORAL | 0 refills | Status: DC

## 2023-01-04 MED ORDER — INSULIN GLARGINE 100 UNIT/ML SOLOSTAR PEN: 25.0000 [IU] | PEN_INJECTOR | Freq: Every day | SUBCUTANEOUS | 0 refills | Status: DC

## 2023-01-04 MED ORDER — METFORMIN HCL ER 500 MG PO TB24: 500.0000 mg | ORAL_TABLET | Freq: Two times a day (BID) | ORAL | 0 refills | Status: DC

## 2023-01-04 MED ORDER — POTASSIUM CHLORIDE CRYS ER 20 MEQ PO TBCR
40.0000 meq | EXTENDED_RELEASE_TABLET | Freq: Once | ORAL | Status: AC
  Administered 2023-01-04: 40 meq via ORAL
  Filled 2023-01-04: qty 2

## 2023-01-04 NOTE — Discharge Summary (Signed)
Physician Discharge Summary  Alexander Graham WJX:[id number] DOB: 1986/03/24 DOA: 01/02/2023  PCP: Pcp, No  Admit date: 01/02/2023 Discharge date: 01/04/2023  Admitted From: home  Disposition:  home   Recommendations for Outpatient Follow-up:  Follow up with PCP in 1-2 weeks Get a referral from your PCP to see an endocrinologist for new onset DM2   Home Health: no  Equipment/Devices:  Discharge Condition: stable  CODE STATUS: full  Diet recommendation: Carb Modified   Brief/Interim Summary: HPI was taken from Dr. Irena Cords:  Alexander Graham is a 37 y.o. male with medical history significant for tobacco abuse and obesity comes for polyuria and polydipsia x few days . Headaches blurred vision paresthesias falls chest pain palpitation shortness of breath.  Patient works in [employer].  And have counseled him about choosing healthier options for meals.  Discussed with patient about his tobacco and complications of strokes heart disease heart attacks and kidney disease vascular complications and amputations.  Patient verbalizes understanding wife at bedside also agrees that they are going to be making some lifestyle changes. In the emergency room patient is alert awake oriented in no distress, afebrile. Initial BMP shows sodium 133 glucose 198 creatinine 1.31 previous creatinine is 1.20 in 2021, normal LFTs, CBC shows leukocytosis of 11.1 hemoglobin of 17.3 normal platelets and MCV. Urine drug screen is positive for cocaine and THC  Discharge Diagnoses:  Principal Problem:   DM2 (diabetes mellitus, type 2) (HCC) Active Problems:   DM (diabetes mellitus), type 2 (HCC)   CKD (chronic kidney disease) stage 2, GFR 60-89 ml/min   Tobacco abuse counseling   Cocaine abuse (HCC)  DM2: new onset, HbA1c 10.6. Continue on glargine, SSI w/ accuchecks. Received DM2 education. D/c home on lantus pen & metformin 500mg  BID    Cocaine abuse: urine drug screen was positive for cocaine, marijuana. Received  illicit drug use cessation counseling    Smoker: nicotine patch to prevent w/drawal. Received smoking cessation counseling already   Hypokalemia: KCl repleated    CKDII: Cr is trending down daily    Morbid obesity: BMI 45.3. Complicates overall care & prognosis   Discharge Instructions  Discharge Instructions     Diet Carb Modified   Complete by: As directed    Discharge instructions   Complete by: As directed    F/u w/ PCP in 1-2 weeks. Get a referral from your PCP to see an endocrinologist for new onset DM2. Check your blood sugars three times a day   Increase activity slowly   Complete by: As directed       Allergies as of 01/04/2023       Reactions   Grass Extracts [gramineae Pollens]    Only when wet        Medication List     TAKE these medications    albuterol 108 (90 Base) MCG/ACT inhaler Commonly known as: VENTOLIN HFA Inhale 2 puffs into the lungs every 6 (six) hours as needed for wheezing or shortness of breath.   clotrimazole-betamethasone cream Commonly known as: Lotrisone Apply 1 Application topically 2 (two) times daily.   EPINEPHrine 0.3 mg/0.3 mL Soaj injection Commonly known as: EpiPen 2-Pak Inject 0.3 mLs (0.3 mg total) into the muscle once.   insulin glargine 100 UNIT/ML Solostar Pen Commonly known as: LANTUS Inject 25 Units into the skin at bedtime.   Insulin Pen Needle 31G X 8 MM Misc Lantus 25 units at bedtime x 30 days. No refills   metformin 500 MG (OSM) 24  hr tablet Commonly known as: FORTAMET Take 1 tablet (500 mg total) by mouth 2 (two) times daily with a meal.   nystatin powder Commonly known as: MYCOSTATIN/NYSTOP Apply 1 Application topically 3 (three) times daily.        Allergies  Allergen Reactions   Grass Extracts [Gramineae Pollens]     Only when wet    Consultations:    Procedures/Studies: No results found. (Echo, Carotid, EGD, Colonoscopy, ERCP)    Subjective: Pt denies any complaints     Discharge Exam: Vitals:   01/03/23 2050 01/04/23 0418  BP: (!) 138/95 (!) 116/105  Pulse: 90 88  Resp: 19 18  Temp: 98.4 F (36.9 C) 98.2 F (36.8 C)  SpO2: 99% 93%   Vitals:   01/03/23 0755 01/03/23 2050 01/04/23 0418 01/04/23 0456  BP: 125/89 (!) 138/95 (!) 116/105   Pulse: 83 90 88   Resp: 18 19 18    Temp: 98 F (36.7 C) 98.4 F (36.9 C) 98.2 F (36.8 C)   TempSrc:  Oral Oral   SpO2: 97% 99% 93%   Weight:    (!) 155.2 kg  Height:        General: Pt is alert, awake, not in acute distress Cardiovascular: S1/S2 +, no rubs, no gallops Respiratory: CTA bilaterally, no wheezing, no rhonchi Abdominal: Soft, NT, obese, bowel sounds + Extremities: no edema, no cyanosis    The results of significant diagnostics from this hospitalization (including imaging, microbiology, ancillary and laboratory) are listed below for reference.     Microbiology: No results found for this or any previous visit (from the past 240 hour(s)).   Labs: BNP (last 3 results) No results for input(s): "BNP" in the last 8760 hours. Basic Metabolic Panel: Recent Labs  Lab 01/02/23 1645 01/03/23 0511 01/04/23 0503  NA 133* 135 135  K 4.4 3.2* 3.3*  CL 97* 100 100  CO2 25 24 26   GLUCOSE 598* 316* 235*  BUN 15 12 12   CREATININE 1.31* 1.03 0.87  CALCIUM 9.9 9.0 8.7*  MG  --   --  1.8   Liver Function Tests: Recent Labs  Lab 01/02/23 1855 01/03/23 0511  AST 16 13*  ALT 24 19  ALKPHOS 86 64  BILITOT 0.9 0.8  PROT 8.9* 7.5  ALBUMIN 4.4 3.7   No results for input(s): "LIPASE", "AMYLASE" in the last 168 hours. No results for input(s): "AMMONIA" in the last 168 hours. CBC: Recent Labs  Lab 01/02/23 1645 01/03/23 0511  WBC 11.1* 10.5  HGB 17.3* 15.3  HCT 52.5* 47.0  MCV 83.7 84.4  PLT 226 186   Cardiac Enzymes: No results for input(s): "CKTOTAL", "CKMB", "CKMBINDEX", "TROPONINI" in the last 168 hours. BNP: Invalid input(s): "POCBNP" CBG: Recent Labs  Lab 01/03/23 1610  01/03/23 2042 01/03/23 2319 01/04/23 0417 01/04/23 0747  GLUCAP 230* 325* 243* 234* 210*   D-Dimer No results for input(s): "DDIMER" in the last 72 hours. Hgb A1c Recent Labs    01/02/23 1645  HGBA1C 10.6*   Lipid Profile No results for input(s): "CHOL", "HDL", "LDLCALC", "TRIG", "CHOLHDL", "LDLDIRECT" in the last 72 hours. Thyroid function studies No results for input(s): "TSH", "T4TOTAL", "T3FREE", "THYROIDAB" in the last 72 hours.  Invalid input(s): "FREET3" Anemia work up No results for input(s): "VITAMINB12", "FOLATE", "FERRITIN", "TIBC", "IRON", "RETICCTPCT" in the last 72 hours. Urinalysis    Component Value Date/Time   COLORURINE STRAW (A) 01/02/2023 1645   APPEARANCEUR CLEAR (A) 01/02/2023 1645   APPEARANCEUR Clear 05/31/2014 2210  LABSPEC 1.030 01/02/2023 1645   LABSPEC 1.024 05/31/2014 2210   PHURINE 5.0 01/02/2023 1645   GLUCOSEU >=500 (A) 01/02/2023 1645   GLUCOSEU Negative 05/31/2014 2210   HGBUR NEGATIVE 01/02/2023 1645   BILIRUBINUR NEGATIVE 01/02/2023 1645   BILIRUBINUR Negative 05/31/2014 2210   KETONESUR NEGATIVE 01/02/2023 1645   PROTEINUR NEGATIVE 01/02/2023 1645   NITRITE NEGATIVE 01/02/2023 1645   LEUKOCYTESUR NEGATIVE 01/02/2023 1645   LEUKOCYTESUR Negative 05/31/2014 2210   Sepsis Labs Recent Labs  Lab 01/02/23 1645 01/03/23 0511  WBC 11.1* 10.5   Microbiology No results found for this or any previous visit (from the past 240 hour(s)).   Time coordinating discharge: Over 30 minutes  SIGNED:   Charise Killian, MD  [facility] 01/04/2023, 11:21 AM Pager   If 7PM-7AM, please contact night-coverage [email or web address]

## 2023-01-04 NOTE — Progress Notes (Signed)
Patient ambulating to exit accompanied by family. Discharged in stable condition.  Alexander Graham Reasons

## 2023-01-04 NOTE — TOC Transition Note (Signed)
Transition of Care [facility]) - CM/SW Discharge Note   Patient Details  Name: Ravi Kauk MRN: [id number] Date of Birth: [birth year]  Transition of Care [facility] CM/SW Contact:  Chapman Fitch, RN Phone Number: 01/04/2023, 11:42 AM   Clinical Narrative:     Patient to discharge today States his wife will transport at discharge Printed good rx coupons for dc medications Lantus is $35, and metformin is $4 with out a coupon   Patient confirms he will be able to obtain medications at discharge  Reviewed process of establishing care at at [facility], [facility] has provided application.  Notified patient that once he establishes care at [facility] he would then qualify for medication assistance through [facility] pharmacy.  Patient expresses understanding          Patient Goals and CMS Choice      Discharge Placement                         Discharge Plan and Services Additional resources added to the After Visit Summary for                                       Social Determinants of Health (SDOH) Interventions SDOH Screenings   Food Insecurity: No Food Insecurity (01/02/2023)  Housing: Low Risk  (01/02/2023)  Transportation Needs: No Transportation Needs (01/02/2023)  Utilities: Not At Risk (01/02/2023)  Tobacco Use: High Risk (04/27/2022)     Readmission Risk Interventions     No data to display

## 2023-01-04 NOTE — Plan of Care (Signed)
Adequate for discharge.

## 2023-01-19 ENCOUNTER — Telehealth: Payer: Self-pay | Admitting: Internal Medicine

## 2023-01-19 ENCOUNTER — Ambulatory Visit: Payer: BLUE CROSS/BLUE SHIELD | Admitting: Physician Assistant

## 2023-01-19 NOTE — Telephone Encounter (Signed)
New patient no show. Discharged-Toni 

## 2023-03-25 ENCOUNTER — Ambulatory Visit: Payer: BLUE CROSS/BLUE SHIELD | Admitting: Nurse Practitioner

## 2023-03-25 ENCOUNTER — Encounter: Payer: Self-pay | Admitting: Nurse Practitioner

## 2023-03-25 VITALS — BP 148/90 | HR 84 | Temp 98.3°F | Resp 16 | Ht 72.0 in | Wt 338.2 lb

## 2023-03-25 DIAGNOSIS — Z9109 Other allergy status, other than to drugs and biological substances: Secondary | ICD-10-CM

## 2023-03-25 DIAGNOSIS — R030 Elevated blood-pressure reading, without diagnosis of hypertension: Secondary | ICD-10-CM | POA: Diagnosis not present

## 2023-03-25 DIAGNOSIS — Z79899 Other long term (current) drug therapy: Secondary | ICD-10-CM

## 2023-03-25 DIAGNOSIS — F17210 Nicotine dependence, cigarettes, uncomplicated: Secondary | ICD-10-CM

## 2023-03-25 DIAGNOSIS — Z794 Long term (current) use of insulin: Secondary | ICD-10-CM

## 2023-03-25 DIAGNOSIS — N182 Chronic kidney disease, stage 2 (mild): Secondary | ICD-10-CM

## 2023-03-25 DIAGNOSIS — E1122 Type 2 diabetes mellitus with diabetic chronic kidney disease: Secondary | ICD-10-CM | POA: Diagnosis not present

## 2023-03-25 MED ORDER — INSULIN GLARGINE 100 UNIT/ML SOLOSTAR PEN
25.0000 [IU] | PEN_INJECTOR | Freq: Every day | SUBCUTANEOUS | 0 refills | Status: DC
Start: 2023-03-25 — End: 2023-04-30

## 2023-03-25 MED ORDER — EPINEPHRINE 0.3 MG/0.3ML IJ SOAJ
0.3000 mg | Freq: Once | INTRAMUSCULAR | 2 refills | Status: DC
Start: 2023-03-25 — End: 2023-03-25

## 2023-03-25 MED ORDER — METFORMIN HCL ER 500 MG PO TB24
500.0000 mg | ORAL_TABLET | Freq: Two times a day (BID) | ORAL | 0 refills | Status: DC
Start: 2023-03-25 — End: 2023-04-30

## 2023-03-25 MED ORDER — NYSTATIN 100000 UNIT/GM EX POWD
1.0000 | Freq: Three times a day (TID) | CUTANEOUS | 0 refills | Status: AC
Start: 2023-03-25 — End: ?

## 2023-03-25 MED ORDER — CLOTRIMAZOLE-BETAMETHASONE 1-0.05 % EX CREA
1.0000 | TOPICAL_CREAM | Freq: Two times a day (BID) | CUTANEOUS | 0 refills | Status: AC
Start: 2023-03-25 — End: ?

## 2023-03-25 MED ORDER — EPINEPHRINE 0.3 MG/0.3ML IJ SOAJ
0.3000 mg | INTRAMUSCULAR | 2 refills | Status: DC | PRN
Start: 2023-03-25 — End: 2023-03-25

## 2023-03-25 MED ORDER — ALBUTEROL SULFATE HFA 108 (90 BASE) MCG/ACT IN AERS
2.0000 | INHALATION_SPRAY | Freq: Four times a day (QID) | RESPIRATORY_TRACT | 0 refills | Status: DC | PRN
Start: 2023-03-25 — End: 2023-03-25

## 2023-03-25 MED ORDER — EPINEPHRINE 0.3 MG/0.3ML IJ SOAJ
0.3000 mg | INTRAMUSCULAR | 2 refills | Status: AC | PRN
Start: 2023-03-25 — End: ?

## 2023-03-25 MED ORDER — ALBUTEROL SULFATE HFA 108 (90 BASE) MCG/ACT IN AERS
2.0000 | INHALATION_SPRAY | Freq: Four times a day (QID) | RESPIRATORY_TRACT | 0 refills | Status: AC | PRN
Start: 2023-03-25 — End: ?

## 2023-03-25 NOTE — Progress Notes (Signed)
[facility] [address] [place], [place] [address]  Internal MEDICINE  Office Visit Note  Patient Name: Alexander Graham  [id number]  [id number]  Date of Service: 03/25/2023   Complaints/HPI Pt is here for establishment of PCP. Chief Complaint  Patient presents with   New Patient (Initial Visit)    Diabetes, getting older, need to get healthcare straight.     HPI Kenton presents for a new patient visit to establish care.  Well-appearing 37 y.o. male with diabetes diagnosed in July this year.  A1c is 10.6 Work: [employer] Home: live with wife at home  Diet: pretty good, no regular soda, limit fried food to once a week, lots of veggies and fruits.  Exercise: walking daily  Tobacco use: smoker 4-5 cig per day  Alcohol use: once a week 1-2 drinks on Saturday  Illicit drug use: thc gummies 1-2 times per week.   Eye exam: not recent foot exam: not recent Labs: due for repeat labs  New or worsening pain: none  Dog saved his life by keeping him awake until wife got home from work and he went to ER for high sugars.  Broke phone this morning and cursing people on the road  BP 132/74 at home.    Current Medication: Outpatient Encounter Medications as of 03/25/2023  Medication Sig   albuterol (VENTOLIN HFA) 108 (90 Base) MCG/ACT inhaler Inhale 2 puffs into the lungs every 6 (six) hours as needed for wheezing or shortness of breath.   clotrimazole-betamethasone (LOTRISONE) cream Apply 1 Application topically 2 (two) times daily.   EPINEPHrine (EPIPEN 2-PAK) 0.3 mg/0.3 mL IJ SOAJ injection Inject 0.3 mg into the muscle once for 1 dose.   insulin glargine (LANTUS) 100 UNIT/ML Solostar Pen Inject 25 Units into the skin at bedtime.   Insulin Pen Needle 31G X 8 MM MISC Lantus 25 units at bedtime x 30 days. No refills   metFORMIN (GLUCOPHAGE-XR) 500 MG 24 hr tablet Take 1 tablet (500 mg total) by mouth 2 (two) times daily with a meal.   nystatin  (MYCOSTATIN/NYSTOP) powder Apply 1 Application topically 3 (three) times daily.   [DISCONTINUED] albuterol (VENTOLIN HFA) 108 (90 Base) MCG/ACT inhaler Inhale 2 puffs into the lungs every 6 (six) hours as needed for wheezing or shortness of breath. (Patient not taking: Reported on 01/02/2023)   [DISCONTINUED] clotrimazole-betamethasone (LOTRISONE) cream Apply 1 Application topically 2 (two) times daily. (Patient not taking: Reported on 01/02/2023)   [DISCONTINUED] EPINEPHrine (EPIPEN 2-PAK) 0.3 mg/0.3 mL IJ SOAJ injection Inject 0.3 mLs (0.3 mg total) into the muscle once. (Patient not taking: Reported on 01/02/2023)   [DISCONTINUED] insulin glargine (LANTUS) 100 UNIT/ML Solostar Pen Inject 25 Units into the skin at bedtime.   [DISCONTINUED] metFORMIN (GLUCOPHAGE-XR) 500 MG 24 hr tablet Take 1 tablet (500 mg total) by mouth 2 (two) times daily with a meal.   [DISCONTINUED] nystatin (MYCOSTATIN/NYSTOP) powder Apply 1 Application topically 3 (three) times daily. (Patient not taking: Reported on 01/02/2023)   No facility-administered encounter medications on file as of 03/25/2023.    Surgical History: No past surgical history on file.  Medical History: No past medical history on file.  Family History: No family history on file.  Social History   Socioeconomic History   Marital status: Married    Spouse name: Not on file   Number of children: Not on file   Years of education: Not on file   Highest education level: Not on file  Occupational History  Not on file  Tobacco Use   Smoking status: Every Day    Types: Cigarettes   Smokeless tobacco: Never  Substance and Sexual Activity   Alcohol use: Yes   Drug use: No   Sexual activity: Not on file  Other Topics Concern   Not on file  Social History Narrative   Not on file   Social Determinants of Health   Financial Resource Strain: Not on file  Food Insecurity: No Food Insecurity (01/02/2023)   Hunger Vital Sign    Worried About Running  Out of Food in the Last Year: Never true    Ran Out of Food in the Last Year: Never true  Transportation Needs: No Transportation Needs (01/02/2023)   PRAPARE - Administrator, Civil Service (Medical): No    Lack of Transportation (Non-Medical): No  Physical Activity: Not on file  Stress: Not on file  Social Connections: Not on file  Intimate Partner Violence: Not At Risk (01/02/2023)   Humiliation, Afraid, Rape, and Kick questionnaire    Fear of Current or Ex-Partner: No    Emotionally Abused: No    Physically Abused: No    Sexually Abused: No     Review of Systems  Constitutional:  Negative for chills, fatigue and unexpected weight change.  HENT:  Positive for postnasal drip. Negative for congestion, rhinorrhea, sneezing and sore throat.   Eyes:  Negative for redness.  Respiratory:  Negative for cough, chest tightness and shortness of breath.   Cardiovascular:  Negative for chest pain and palpitations.  Gastrointestinal:  Negative for abdominal pain, constipation, diarrhea, nausea and vomiting.  Genitourinary:  Negative for dysuria and frequency.  Musculoskeletal:  Negative for arthralgias, back pain, joint swelling and neck pain.  Skin:  Negative for rash.  Neurological: Negative.  Negative for tremors and numbness.  Hematological:  Negative for adenopathy. Does not bruise/bleed easily.  Psychiatric/Behavioral:  Negative for behavioral problems (Depression), sleep disturbance and suicidal ideas. The patient is not nervous/anxious.     Vital Signs: BP (!) 148/90 (BP Location: Left Arm) Comment: 150/96  Pulse 84   Temp 98.3 F (36.8 C) (Temporal)   Resp 16   Ht 6' (1.829 m)   Wt (!) 338 lb 3.2 oz (153.4 kg)   SpO2 97%   BMI 45.87 kg/m    Physical Exam Constitutional:      General: He is not in acute distress.    Appearance: Normal appearance. He is obese. He is not ill-appearing.  HENT:     Head: Normocephalic and atraumatic.  Eyes:     Pupils: Pupils are  equal, round, and reactive to light.  Cardiovascular:     Rate and Rhythm: Normal rate and regular rhythm.  Pulmonary:     Effort: Pulmonary effort is normal. No respiratory distress.  Neurological:     Mental Status: He is alert and oriented to person, place, and time.  Psychiatric:        Mood and Affect: Mood normal.        Behavior: Behavior normal.       Assessment/Plan: 1. Type 2 diabetes mellitus with stage 2 chronic kidney disease, with long-term current use of insulin (HCC) Continue metformin and lantus as prescribed. Continue to check glucose at least once daily. Follow up in about 1 month to review labs and glucose readings. - metFORMIN (GLUCOPHAGE-XR) 500 MG 24 hr tablet; Take 1 tablet (500 mg total) by mouth 2 (two) times daily with a meal.  Dispense: 60 tablet; Refill: 0 - insulin glargine (LANTUS) 100 UNIT/ML Solostar Pen; Inject 25 Units into the skin at bedtime.  Dispense: 7.5 mL; Refill: 0  2. CKD (chronic kidney disease) stage 2, GFR 60-89 ml/min No interventions at this time, continue to monitor health status and labs.  3. Elevated blood pressure reading in office with white coat syndrome, without diagnosis of hypertension BP repeated and improved some but still elevated. Home BP is normal.   4. 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  - EPINEPHrine (EPIPEN 2-PAK) 0.3 mg/0.3 mL IJ SOAJ injection; Inject 0.3 mg into the muscle once for 1 dose.  Dispense: 1 each; Refill: 2 - albuterol (VENTOLIN HFA) 108 (90 Base) MCG/ACT inhaler; Inhale 2 puffs into the lungs every 6 (six) hours as needed for wheezing or shortness of breath.  Dispense: 8 g; Refill: 0  5. Encounter for medication review Medication list reviewed, updated and refills ordered - nystatin (MYCOSTATIN/NYSTOP) powder; Apply 1 Application topically 3 (three) times daily.  Dispense: 15 g; Refill: 0 - clotrimazole-betamethasone (LOTRISONE) cream; Apply 1 Application topically 2 (two) times daily.   Dispense: 30 g; Refill: 0  6. Light cigarette smoker Not ready to quit    General Counseling: Manley verbalizes understanding of the findings of todays visit and agrees with plan of treatment. I have discussed any further diagnostic evaluation that may be needed or ordered today. We also reviewed his medications today. he has been encouraged to call the office with any questions or concerns that should arise related to todays visit.    No orders of the defined types were placed in this encounter.   Meds ordered this encounter  Medications   EPINEPHrine (EPIPEN 2-PAK) 0.3 mg/0.3 mL IJ SOAJ injection    Sig: Inject 0.3 mg into the muscle once for 1 dose.    Dispense:  1 each    Refill:  2   metFORMIN (GLUCOPHAGE-XR) 500 MG 24 hr tablet    Sig: Take 1 tablet (500 mg total) by mouth 2 (two) times daily with a meal.    Dispense:  60 tablet    Refill:  0   insulin glargine (LANTUS) 100 UNIT/ML Solostar Pen    Sig: Inject 25 Units into the skin at bedtime.    Dispense:  7.5 mL    Refill:  0   albuterol (VENTOLIN HFA) 108 (90 Base) MCG/ACT inhaler    Sig: Inhale 2 puffs into the lungs every 6 (six) hours as needed for wheezing or shortness of breath.    Dispense:  8 g    Refill:  0    Please supply with spacer   nystatin (MYCOSTATIN/NYSTOP) powder    Sig: Apply 1 Application topically 3 (three) times daily.    Dispense:  15 g    Refill:  0   clotrimazole-betamethasone (LOTRISONE) cream    Sig: Apply 1 Application topically 2 (two) times daily.    Dispense:  30 g    Refill:  0    Return in about 1 month (around 04/24/2023) for CPE, Gary Bultman PCP at earliest available opening, have labs done before visit. .   Time spent:30 Minutes Time spent with patient included reviewing progress notes, labs, imaging studies, and discussing plan for follow up.    Controlled Substance Database was reviewed by me for overdose risk score (ORS)   This patient was seen by Sallyanne Kuster, FNP-C  in collaboration with Dr. Beverely Risen as a part of collaborative care agreement.  Aletta Edmunds R. Tedd Sias, MSN, FNP-C Internal Medicine

## 2023-04-22 ENCOUNTER — Encounter: Payer: BLUE CROSS/BLUE SHIELD | Admitting: Nurse Practitioner

## 2023-04-28 ENCOUNTER — Other Ambulatory Visit: Payer: Self-pay | Admitting: Nurse Practitioner

## 2023-04-29 ENCOUNTER — Encounter: Payer: BLUE CROSS/BLUE SHIELD | Admitting: Nurse Practitioner

## 2023-04-29 LAB — CBC WITH DIFFERENTIAL/PLATELET
Basophils Absolute: 0.1 10*3/uL (ref 0.0–0.2)
Basos: 1 %
EOS (ABSOLUTE): 0.5 10*3/uL — ABNORMAL HIGH (ref 0.0–0.4)
Eos: 6 %
Hematocrit: 49 % (ref 37.5–51.0)
Hemoglobin: 15.6 g/dL (ref 13.0–17.7)
Immature Grans (Abs): 0 10*3/uL (ref 0.0–0.1)
Immature Granulocytes: 0 %
Lymphocytes Absolute: 2.6 10*3/uL (ref 0.7–3.1)
Lymphs: 33 %
MCH: 27.4 pg (ref 26.6–33.0)
MCHC: 31.8 g/dL (ref 31.5–35.7)
MCV: 86 fL (ref 79–97)
Monocytes Absolute: 0.6 10*3/uL (ref 0.1–0.9)
Monocytes: 7 %
Neutrophils Absolute: 4 10*3/uL (ref 1.4–7.0)
Neutrophils: 53 %
Platelets: 205 10*3/uL (ref 150–450)
RBC: 5.69 x10E6/uL (ref 4.14–5.80)
RDW: 13.5 % (ref 11.6–15.4)
WBC: 7.7 10*3/uL (ref 3.4–10.8)

## 2023-04-29 LAB — LIPID PANEL
Chol/HDL Ratio: 3.6 ratio (ref 0.0–5.0)
Cholesterol, Total: 199 mg/dL (ref 100–199)
HDL: 55 mg/dL (ref >39)
LDL Chol Calc (NIH): 129 mg/dL — ABNORMAL HIGH (ref 0–99)
Triglycerides: 82 mg/dL (ref 0–149)
VLDL Cholesterol Cal: 15 mg/dL (ref 5–40)

## 2023-04-29 LAB — COMPREHENSIVE METABOLIC PANEL
ALT: 20 [IU]/L (ref 0–44)
AST: 15 [IU]/L (ref 0–40)
Albumin: 4.3 g/dL (ref 4.1–5.1)
Alkaline Phosphatase: 58 [IU]/L (ref 44–121)
BUN/Creatinine Ratio: 12 (ref 9–20)
BUN: 14 mg/dL (ref 6–20)
Bilirubin Total: 0.3 mg/dL (ref 0.0–1.2)
CO2: 23 mmol/L (ref 20–29)
Calcium: 9.5 mg/dL (ref 8.7–10.2)
Chloride: 102 mmol/L (ref 96–106)
Creatinine, Ser: 1.13 mg/dL (ref 0.76–1.27)
Globulin, Total: 3 g/dL (ref 1.5–4.5)
Glucose: 90 mg/dL (ref 70–99)
Potassium: 4 mmol/L (ref 3.5–5.2)
Sodium: 138 mmol/L (ref 134–144)
Total Protein: 7.3 g/dL (ref 6.0–8.5)
eGFR: 86 mL/min/{1.73_m2} (ref >59)

## 2023-04-29 LAB — HGB A1C W/O EAG: Hgb A1c MFr Bld: 6.7 % — ABNORMAL HIGH (ref 4.8–5.6)

## 2023-04-29 LAB — VITAMIN D 25 HYDROXY (VIT D DEFICIENCY, FRACTURES): Vit D, 25-Hydroxy: 12.3 ng/mL — ABNORMAL LOW (ref 30.0–100.0)

## 2023-04-30 ENCOUNTER — Encounter: Payer: Self-pay | Admitting: Nurse Practitioner

## 2023-04-30 ENCOUNTER — Ambulatory Visit: Payer: BLUE CROSS/BLUE SHIELD | Admitting: Nurse Practitioner

## 2023-04-30 VITALS — BP 135/88 | HR 96 | Temp 98.6°F | Resp 16 | Ht 72.0 in | Wt 334.6 lb

## 2023-04-30 DIAGNOSIS — E1159 Type 2 diabetes mellitus with other circulatory complications: Secondary | ICD-10-CM | POA: Diagnosis not present

## 2023-04-30 DIAGNOSIS — E785 Hyperlipidemia, unspecified: Secondary | ICD-10-CM

## 2023-04-30 DIAGNOSIS — Z0001 Encounter for general adult medical examination with abnormal findings: Secondary | ICD-10-CM | POA: Diagnosis not present

## 2023-04-30 DIAGNOSIS — E1169 Type 2 diabetes mellitus with other specified complication: Secondary | ICD-10-CM

## 2023-04-30 DIAGNOSIS — E1122 Type 2 diabetes mellitus with diabetic chronic kidney disease: Secondary | ICD-10-CM

## 2023-04-30 DIAGNOSIS — N182 Chronic kidney disease, stage 2 (mild): Secondary | ICD-10-CM | POA: Diagnosis not present

## 2023-04-30 DIAGNOSIS — I152 Hypertension secondary to endocrine disorders: Secondary | ICD-10-CM

## 2023-04-30 DIAGNOSIS — Z794 Long term (current) use of insulin: Secondary | ICD-10-CM

## 2023-04-30 MED ORDER — METFORMIN HCL ER 500 MG PO TB24: 500.0000 mg | ORAL_TABLET | Freq: Two times a day (BID) | ORAL | 1 refills | Status: DC

## 2023-04-30 MED ORDER — INSULIN GLARGINE 100 UNIT/ML SOLOSTAR PEN: 25.0000 [IU] | PEN_INJECTOR | Freq: Every day | SUBCUTANEOUS | 3 refills | Status: DC

## 2023-04-30 NOTE — Progress Notes (Cosign Needed)
[facility] [address] [place], [place] [address]  Internal MEDICINE  Office Visit Note  Patient Name: Alexander Graham  [id number]  [id number]  Date of Service: 04/30/2023  Chief Complaint  Patient presents with   Annual Exam    HPI [name] presents for an annual well visit and physical exam.  Well-appearing 37 y.o. male with type 2 diabetes, hypertension and high cholesterol.  Eye exam: due now, patient will find an eye doctor Foot exam: done today Labs: lab results reviewed with the patient.  New or worsening pain: none  Other concerns: none    Current Medication: Outpatient Encounter Medications as of 04/30/2023  Medication Sig   albuterol (VENTOLIN HFA) 108 (90 Base) MCG/ACT inhaler Inhale 2 puffs into the lungs every 6 (six) hours as needed for wheezing or shortness of breath.   clotrimazole-betamethasone (LOTRISONE) cream Apply 1 Application topically 2 (two) times daily.   EPINEPHrine (EPIPEN 2-PAK) 0.3 mg/0.3 mL IJ SOAJ injection Inject 0.3 mg into the muscle as needed for anaphylaxis.   Insulin Pen Needle 31G X 8 MM MISC Lantus 25 units at bedtime x 30 days. No refills   nystatin (MYCOSTATIN/NYSTOP) powder Apply 1 Application topically 3 (three) times daily.   insulin glargine (LANTUS) 100 UNIT/ML Solostar Pen Inject 25 Units into the skin at bedtime.   metFORMIN (GLUCOPHAGE-XR) 500 MG 24 hr tablet Take 1 tablet (500 mg total) by mouth 2 (two) times daily with a meal.   [DISCONTINUED] insulin glargine (LANTUS) 100 UNIT/ML Solostar Pen Inject 25 Units into the skin at bedtime.   [DISCONTINUED] metFORMIN (GLUCOPHAGE-XR) 500 MG 24 hr tablet Take 1 tablet (500 mg total) by mouth 2 (two) times daily with a meal.   No facility-administered encounter medications on file as of 04/30/2023.    Surgical History: History reviewed. No pertinent surgical history.  Medical History: History reviewed. No pertinent past medical history.  Family  History: History reviewed. No pertinent family history.  Social History   Socioeconomic History   Marital status: Married    Spouse name: Not on file   Number of children: Not on file   Years of education: Not on file   Highest education level: Not on file  Occupational History   Not on file  Tobacco Use   Smoking status: Every Day    Types: Cigarettes   Smokeless tobacco: Never  Substance and Sexual Activity   Alcohol use: Not Currently   Drug use: No   Sexual activity: Not on file  Other Topics Concern   Not on file  Social History Narrative   Not on file   Social Drivers of Health   Financial Resource Strain: Not on file  Food Insecurity: No Food Insecurity (01/02/2023)   Hunger Vital Sign    Worried About Running Out of Food in the Last Year: Never true    Ran Out of Food in the Last Year: Never true  Transportation Needs: No Transportation Needs (01/02/2023)   PRAPARE - Administrator, Civil Service (Medical): No    Lack of Transportation (Non-Medical): No  Physical Activity: Not on file  Stress: Not on file  Social Connections: Not on file  Intimate Partner Violence: Not At Risk (01/02/2023)   Humiliation, Afraid, Rape, and Kick questionnaire    Fear of Current or Ex-Partner: No    Emotionally Abused: No    Physically Abused: No    Sexually Abused: No      Review of Systems  Constitutional:  Negative for activity change, appetite change, chills, fatigue, fever and unexpected weight change.  HENT:  Positive for postnasal drip. Negative for congestion, ear pain, rhinorrhea, sneezing, sore throat and trouble swallowing.   Eyes: Negative.  Negative for redness.  Respiratory: Negative.  Negative for cough, chest tightness, shortness of breath and wheezing.   Cardiovascular: Negative.  Negative for chest pain and palpitations.  Gastrointestinal: Negative.  Negative for abdominal pain, blood in stool, constipation, diarrhea, nausea and vomiting.   Endocrine: Negative.   Genitourinary: Negative.  Negative for difficulty urinating, dysuria, frequency, hematuria and urgency.  Musculoskeletal: Negative.  Negative for arthralgias, back pain, joint swelling, myalgias and neck pain.  Skin: Negative.  Negative for rash and wound.  Allergic/Immunologic: Negative.  Negative for immunocompromised state.  Neurological: Negative.  Negative for dizziness, tremors, seizures, numbness and headaches.  Hematological: Negative.  Negative for adenopathy. Does not bruise/bleed easily.  Psychiatric/Behavioral: Negative.  Negative for behavioral problems (Depression), self-injury, sleep disturbance and suicidal ideas. The patient is not nervous/anxious.     Vital Signs: BP 135/88 Comment: 150/98  Pulse 96   Temp 98.6 F (37 C)   Resp 16   Ht 6' (1.829 m)   Wt (!) 334 lb 9.6 oz (151.8 kg)   SpO2 97%   BMI 45.38 kg/m    Physical Exam Vitals reviewed.  Constitutional:      General: He is not in acute distress.    Appearance: Normal appearance. He is well-developed. He is obese. He is not ill-appearing or diaphoretic.  HENT:     Head: Normocephalic and atraumatic.     Right Ear: Tympanic membrane, ear canal and external ear normal.     Left Ear: Tympanic membrane, ear canal and external ear normal.     Nose: Nose normal. No congestion or rhinorrhea.     Mouth/Throat:     Mouth: Mucous membranes are moist.     Pharynx: Oropharynx is clear. No oropharyngeal exudate or posterior oropharyngeal erythema.  Eyes:     General: No scleral icterus.       Right eye: No discharge.        Left eye: No discharge.     Extraocular Movements: Extraocular movements intact.     Conjunctiva/sclera: Conjunctivae normal.     Pupils: Pupils are equal, round, and reactive to light.  Neck:     Thyroid: No thyromegaly.     Vascular: No JVD.     Trachea: No tracheal deviation.  Cardiovascular:     Rate and Rhythm: Normal rate and regular rhythm.     Pulses:  Normal pulses.     Heart sounds: Normal heart sounds. No murmur heard.    No friction rub. No gallop.  Pulmonary:     Effort: Pulmonary effort is normal. No respiratory distress.     Breath sounds: Normal breath sounds. No stridor. No wheezing or rales.  Chest:     Chest wall: No tenderness.  Abdominal:     General: Bowel sounds are normal. There is no distension.     Palpations: Abdomen is soft. There is no mass.     Tenderness: There is no abdominal tenderness. There is no guarding or rebound.  Musculoskeletal:        General: No tenderness or deformity. Normal range of motion.     Cervical back: Normal range of motion and neck supple.     Right lower leg: No edema.     Left lower leg: No edema.  Lymphadenopathy:     Cervical: No cervical adenopathy.  Skin:    General: Skin is warm and dry.     Capillary Refill: Capillary refill takes less than 2 seconds.     Coloration: Skin is not pale.     Findings: No erythema or rash.  Neurological:     Mental Status: He is alert and oriented to person, place, and time.     Cranial Nerves: No cranial nerve deficit.     Motor: No abnormal muscle tone.     Coordination: Coordination normal.     Gait: Gait normal.     Deep Tendon Reflexes: Reflexes are normal and symmetric.  Psychiatric:        Mood and Affect: Mood normal.        Behavior: Behavior normal.        Thought Content: Thought content normal.        Judgment: Judgment normal.        Assessment/Plan: 1. Encounter for routine adult health examination with abnormal findings Age-appropriate preventive screenings and vaccinations discussed, annual physical exam completed. Routine labs for health maintenance ordered (if needed). PHM updated.    2. Type 2 diabetes mellitus with stage 2 chronic kidney disease, with long-term current use of insulin (HCC) Stable, A1c significantly improved to 6.7 now. Continue lantus insulin and metformin as prescribed.  - metFORMIN  (GLUCOPHAGE-XR) 500 MG 24 hr tablet; Take 1 tablet (500 mg total) by mouth 2 (two) times daily with a meal.  Dispense: 180 tablet; Refill: 1 - insulin glargine (LANTUS) 100 UNIT/ML Solostar Pen; Inject 25 Units into the skin at bedtime.  Dispense: 15 mL; Refill: 3  3. Hypertension associated with type 2 diabetes mellitus (HCC) Stable, not currently on any BP medications. Continue to monitor.   4. Hyperlipidemia associated with type 2 diabetes mellitus (HCC) Limit red meat intake, increase lean proteins in diet and may start a fish oil supplement if desired. Not currently on statin therapy     General Counseling: Arian verbalizes understanding of the findings of todays visit and agrees with plan of treatment. I have discussed any further diagnostic evaluation that may be needed or ordered today. We also reviewed his medications today. he has been encouraged to call the office with any questions or concerns that should arise related to todays visit.    No orders of the defined types were placed in this encounter.   Meds ordered this encounter  Medications   metFORMIN (GLUCOPHAGE-XR) 500 MG 24 hr tablet    Sig: Take 1 tablet (500 mg total) by mouth 2 (two) times daily with a meal.    Dispense:  180 tablet    Refill:  1   insulin glargine (LANTUS) 100 UNIT/ML Solostar Pen    Sig: Inject 25 Units into the skin at bedtime.    Dispense:  15 mL    Refill:  3    Return in about 3 months (around 07/31/2023) for F/U, Recheck A1C, Zeppelin Beckstrand PCP.   Total time spent:30 Minutes Time spent includes review of chart, medications, test results, and follow up plan with the patient.   [place] Controlled Substance Database was reviewed by me.  This patient was seen by Sallyanne Kuster, FNP-C in collaboration with Dr. Beverely Risen as a part of collaborative care agreement.  Kimiyah Blick R. Tedd Sias, MSN, FNP-C Internal medicine

## 2023-05-05 ENCOUNTER — Other Ambulatory Visit: Payer: Self-pay

## 2023-05-05 MED ORDER — ERGOCALCIFEROL 1.25 MG (50000 UT) PO CAPS: 50000.0000 [IU] | ORAL_CAPSULE | ORAL | 5 refills | Status: AC

## 2023-05-09 ENCOUNTER — Other Ambulatory Visit: Payer: Self-pay | Admitting: Internal Medicine

## 2023-05-10 ENCOUNTER — Encounter: Payer: Self-pay | Admitting: Nurse Practitioner

## 2023-06-27 ENCOUNTER — Encounter: Payer: Self-pay | Admitting: Nurse Practitioner

## 2023-08-03 ENCOUNTER — Encounter: Payer: Self-pay | Admitting: Nurse Practitioner

## 2023-08-03 ENCOUNTER — Ambulatory Visit (INDEPENDENT_AMBULATORY_CARE_PROVIDER_SITE_OTHER): Payer: BLUE CROSS/BLUE SHIELD | Admitting: Nurse Practitioner

## 2023-08-03 VITALS — BP 135/70 | HR 100 | Temp 98.3°F | Resp 16 | Ht 72.0 in | Wt 334.6 lb

## 2023-08-03 DIAGNOSIS — N182 Chronic kidney disease, stage 2 (mild): Secondary | ICD-10-CM

## 2023-08-03 DIAGNOSIS — E1122 Type 2 diabetes mellitus with diabetic chronic kidney disease: Secondary | ICD-10-CM | POA: Diagnosis not present

## 2023-08-03 DIAGNOSIS — E1159 Type 2 diabetes mellitus with other circulatory complications: Secondary | ICD-10-CM

## 2023-08-03 DIAGNOSIS — E785 Hyperlipidemia, unspecified: Secondary | ICD-10-CM

## 2023-08-03 DIAGNOSIS — Z794 Long term (current) use of insulin: Secondary | ICD-10-CM | POA: Diagnosis not present

## 2023-08-03 DIAGNOSIS — I152 Hypertension secondary to endocrine disorders: Secondary | ICD-10-CM

## 2023-08-03 DIAGNOSIS — E1169 Type 2 diabetes mellitus with other specified complication: Secondary | ICD-10-CM | POA: Diagnosis not present

## 2023-08-03 LAB — POCT GLYCOSYLATED HEMOGLOBIN (HGB A1C): Hemoglobin A1C: 6.8 % — AB (ref 4.0–5.6)

## 2023-08-03 MED ORDER — EMPAGLIFLOZIN 25 MG PO TABS: 25.0000 mg | ORAL_TABLET | Freq: Every day | ORAL | 3 refills | Status: AC

## 2023-08-03 NOTE — Progress Notes (Signed)
[facility] [address] [place], [place] [address]  Internal MEDICINE  Office Visit Note  Patient Name: Alexander Graham  [id number]  [id number]  Date of Service: 08/03/2023  Chief Complaint  Patient presents with   Diabetes    A1c check    HPI Alexander Graham presents for a follow-up visit for diabetes, hypertension, and high cholesterol.  Diabetes -- A1c about the same, increased by 0.1. patient reports he stopped taking insulin because he was feeling nauseated. He is still taking the metformin XR.  Hypertension -- elevated slightly, improved when rechecked.  High cholesterol -- not currently on statin therapy. Has not gained any weight since his last visit. Last lipid panel was checked in October 2024, LDL was slightly elevated, and the rest of the panel was normal.     Current Medication: Outpatient Encounter Medications as of 08/03/2023  Medication Sig   albuterol (VENTOLIN HFA) 108 (90 Base) MCG/ACT inhaler Inhale 2 puffs into the lungs every 6 (six) hours as needed for wheezing or shortness of breath.   clotrimazole-betamethasone (LOTRISONE) cream Apply 1 Application topically 2 (two) times daily.   empagliflozin (JARDIANCE) 25 MG TABS tablet Take 1 tablet (25 mg total) by mouth daily.   EPINEPHrine (EPIPEN 2-PAK) 0.3 mg/0.3 mL IJ SOAJ injection Inject 0.3 mg into the muscle as needed for anaphylaxis.   ergocalciferol (VITAMIN D2) 1.25 MG (50000 UT) capsule Take 1 capsule (50,000 Units total) by mouth once a week.   metFORMIN (GLUCOPHAGE-XR) 500 MG 24 hr tablet Take 1 tablet (500 mg total) by mouth 2 (two) times daily with a meal.   nystatin (MYCOSTATIN/NYSTOP) powder Apply 1 Application topically 3 (three) times daily.   Spacer/Aero-Holding Chambers (EQ SPACE CHAMBER ANTI-STATIC) DEVI    [DISCONTINUED] insulin glargine (LANTUS) 100 UNIT/ML Solostar Pen Inject 25 Units into the skin at bedtime.   [DISCONTINUED] Insulin Pen Needle 31G X 8 MM MISC Lantus 25 units  at bedtime x 30 days. No refills   No facility-administered encounter medications on file as of 08/03/2023.    Surgical History: History reviewed. No pertinent surgical history.  Medical History: History reviewed. No pertinent past medical history.  Family History: History reviewed. No pertinent family history.  Social History   Socioeconomic History   Marital status: Married    Spouse name: Not on file   Number of children: Not on file   Years of education: Not on file   Highest education level: Not on file  Occupational History   Not on file  Tobacco Use   Smoking status: Every Day    Types: Cigarettes   Smokeless tobacco: Never  Substance and Sexual Activity   Alcohol use: Not Currently   Drug use: No   Sexual activity: Not on file  Other Topics Concern   Not on file  Social History Narrative   Not on file   Social Drivers of Health   Financial Resource Strain: Not on file  Food Insecurity: No Food Insecurity (01/02/2023)   Hunger Vital Sign    Worried About Running Out of Food in the Last Year: Never true    Ran Out of Food in the Last Year: Never true  Transportation Needs: No Transportation Needs (01/02/2023)   PRAPARE - Administrator, Civil Service (Medical): No    Lack of Transportation (Non-Medical): No  Physical Activity: Not on file  Stress: Not on file  Social Connections: Not on file  Intimate Partner Violence: Not At Risk (01/02/2023)  Humiliation, Afraid, Rape, and Kick questionnaire    Fear of Current or Ex-Partner: No    Emotionally Abused: No    Physically Abused: No    Sexually Abused: No      Review of Systems  Constitutional:  Negative for chills, fatigue and unexpected weight change.  HENT:  Positive for postnasal drip. Negative for congestion, rhinorrhea, sneezing and sore throat.   Eyes:  Negative for redness.  Respiratory: Negative.  Negative for cough, chest tightness, shortness of breath and wheezing.   Cardiovascular:  Negative.  Negative for chest pain and palpitations.  Gastrointestinal:  Negative for abdominal pain, constipation, diarrhea, nausea and vomiting.  Genitourinary:  Negative for dysuria and frequency.  Musculoskeletal:  Negative for arthralgias, back pain, joint swelling and neck pain.  Skin:  Negative for rash.  Neurological: Negative.  Negative for tremors and numbness.  Hematological:  Negative for adenopathy. Does not bruise/bleed easily.  Psychiatric/Behavioral:  Negative for behavioral problems (Depression), sleep disturbance and suicidal ideas. The patient is not nervous/anxious.     Vital Signs: BP 135/70 Comment: 140/90  Pulse 100   Temp 98.3 F (36.8 C)   Resp 16   Ht 6' (1.829 m)   Wt (!) 334 lb 9.6 oz (151.8 kg)   SpO2 98%   BMI 45.38 kg/m    Physical Exam Vitals reviewed.  Constitutional:      General: He is not in acute distress.    Appearance: Normal appearance. He is obese. He is not ill-appearing.  HENT:     Head: Normocephalic and atraumatic.  Eyes:     Pupils: Pupils are equal, round, and reactive to light.  Cardiovascular:     Rate and Rhythm: Normal rate and regular rhythm.  Pulmonary:     Effort: Pulmonary effort is normal. No respiratory distress.  Neurological:     Mental Status: He is alert and oriented to person, place, and time.  Psychiatric:        Mood and Affect: Mood normal.        Behavior: Behavior normal.        Assessment/Plan: 1. Type 2 diabetes mellitus with stage 2 chronic kidney disease, with long-term current use of insulin (HCC) (Primary) Jardiance 25 mg daily prescribed, this will also benefit his kidneys and heart. Insulin discontinued for now. A1c remains stable. New medication will likely need prior authorization.  - POCT glycosylated hemoglobin (Hb A1C) - empagliflozin (JARDIANCE) 25 MG TABS tablet; Take 1 tablet (25 mg total) by mouth daily.  Dispense: 30 tablet; Refill: 3  2. Hypertension associated with type 2  diabetes mellitus (HCC) Stable, not currently on BP medications, will continue to monitor.   3. Hyperlipidemia associated with type 2 diabetes mellitus (HCC) Not currently on statin therapy. Will continue to monitor, and repeat lipid panel in 1-3 months.    General Counseling: Alexander Graham verbalizes understanding of the findings of todays visit and agrees with plan of treatment. I have discussed any further diagnostic evaluation that may be needed or ordered today. We also reviewed his medications today. he has been encouraged to call the office with any questions or concerns that should arise related to todays visit.    Orders Placed This Encounter  Procedures   POCT glycosylated hemoglobin (Hb A1C)    Meds ordered this encounter  Medications   empagliflozin (JARDIANCE) 25 MG TABS tablet    Sig: Take 1 tablet (25 mg total) by mouth daily.    Dispense:  30 tablet  Refill:  3    Fill new script today, please send prior authorization request asap if required.    Return in about 1 month (around 08/31/2023) for F/U, eval new med, Christon Parada PCP jardiance.   Total time spent:30 Minutes Time spent includes review of chart, medications, test results, and follow up plan with the patient.   [place] Controlled Substance Database was reviewed by me.  This patient was seen by Sallyanne Kuster, FNP-C in collaboration with Dr. Beverely Risen as a part of collaborative care agreement.   Trashaun Streight R. Tedd Sias, MSN, FNP-C Internal medicine

## 2023-08-31 ENCOUNTER — Ambulatory Visit: Payer: BLUE CROSS/BLUE SHIELD | Admitting: Nurse Practitioner

## 2024-04-28 ENCOUNTER — Telehealth: Payer: Self-pay | Admitting: Nurse Practitioner

## 2024-04-28 NOTE — Telephone Encounter (Signed)
 Left vm and sent mychart message to confirm 05/05/24 appointment-Toni

## 2024-05-05 ENCOUNTER — Encounter: Payer: BLUE CROSS/BLUE SHIELD | Admitting: Nurse Practitioner

## 2024-07-05 ENCOUNTER — Telehealth: Payer: Self-pay | Admitting: Nurse Practitioner

## 2024-07-05 NOTE — Telephone Encounter (Signed)
 No return call to reschedule missed appointment. Mailed letter-Toni

## 2024-08-01 ENCOUNTER — Encounter: Payer: Self-pay | Admitting: Emergency Medicine

## 2024-08-01 ENCOUNTER — Emergency Department
Admission: EM | Admit: 2024-08-01 | Discharge: 2024-08-01 | Disposition: A | Discharge status: Home / Self Care | Payer: Self-pay | Attending: Emergency Medicine | Admitting: Emergency Medicine

## 2024-08-01 ENCOUNTER — Other Ambulatory Visit: Payer: Self-pay

## 2024-08-01 DIAGNOSIS — E1165 Type 2 diabetes mellitus with hyperglycemia: Secondary | ICD-10-CM | POA: Insufficient documentation

## 2024-08-01 DIAGNOSIS — Z7984 Long term (current) use of oral hypoglycemic drugs: Secondary | ICD-10-CM | POA: Insufficient documentation

## 2024-08-01 DIAGNOSIS — R739 Hyperglycemia, unspecified: Secondary | ICD-10-CM

## 2024-08-01 DIAGNOSIS — E1122 Type 2 diabetes mellitus with diabetic chronic kidney disease: Secondary | ICD-10-CM

## 2024-08-01 HISTORY — DX: Type 2 diabetes mellitus without complications: E11.9

## 2024-08-01 LAB — BASIC METABOLIC PANEL WITH GFR
Anion gap: 14 (ref 5–15)
BUN: 14 mg/dL (ref 6–20)
CO2: 20 mmol/L — ABNORMAL LOW (ref 22–32)
Calcium: 9.4 mg/dL (ref 8.9–10.3)
Chloride: 96 mmol/L — ABNORMAL LOW (ref 98–111)
Creatinine, Ser: 1.06 mg/dL (ref 0.61–1.24)
GFR, Estimated: >60 mL/min
Glucose, Bld: 463 mg/dL — ABNORMAL HIGH (ref 70–99)
Potassium: 4.1 mmol/L (ref 3.5–5.1)
Sodium: 131 mmol/L — ABNORMAL LOW (ref 135–145)

## 2024-08-01 LAB — URINALYSIS, ROUTINE W REFLEX MICROSCOPIC
Bacteria, UA: NONE SEEN
Bilirubin Urine: NEGATIVE
Glucose, UA: >=500 mg/dL — AB
Ketones, ur: NEGATIVE mg/dL
Leukocytes,Ua: NEGATIVE
Nitrite: NEGATIVE
Protein, ur: 100 mg/dL — AB
Specific Gravity, Urine: 1.032 — ABNORMAL HIGH (ref 1.005–1.030)
pH: 5 (ref 5.0–8.0)

## 2024-08-01 LAB — CBC
HCT: 50.3 % (ref 39.0–52.0)
Hemoglobin: 16.7 g/dL (ref 13.0–17.0)
MCH: 27.2 pg (ref 26.0–34.0)
MCHC: 33.2 g/dL (ref 30.0–36.0)
MCV: 82.1 fL (ref 80.0–100.0)
Platelets: 198 10*3/uL (ref 150–400)
RBC: 6.13 MIL/uL — ABNORMAL HIGH (ref 4.22–5.81)
RDW: 13.5 % (ref 11.5–15.5)
WBC: 9.9 10*3/uL (ref 4.0–10.5)
nRBC: 0 % (ref 0.0–0.2)

## 2024-08-01 LAB — CBG MONITORING, ED
Glucose-Capillary: 488 mg/dL — ABNORMAL HIGH (ref 70–99)
Glucose-Capillary: 371 mg/dL — ABNORMAL HIGH (ref 70–99)

## 2024-08-01 LAB — BETA-HYDROXYBUTYRIC ACID: Beta-Hydroxybutyric Acid: 0.31 mmol/L — ABNORMAL HIGH (ref 0.05–0.27)

## 2024-08-01 MED ORDER — SODIUM CHLORIDE 0.9 % IV SOLN: Freq: Once | INTRAVENOUS | Status: AC

## 2024-08-01 MED ORDER — INSULIN GLARGINE 100 UNIT/ML SOLOSTAR PEN: 25.0000 [IU] | PEN_INJECTOR | Freq: Every day | SUBCUTANEOUS | 11 refills | Status: AC

## 2024-08-01 MED ORDER — METFORMIN HCL 500 MG PO TABS: 500.0000 mg | ORAL_TABLET | Freq: Two times a day (BID) | ORAL | 3 refills | Status: AC

## 2024-08-01 MED ORDER — INSULIN ASPART 100 UNIT/ML IJ SOLN
8.0000 [IU] | Freq: Once | INTRAMUSCULAR | Status: AC
  Administered 2024-08-01: 8 [IU] via INTRAVENOUS
  Filled 2024-08-01: qty 8

## 2024-08-01 NOTE — ED Triage Notes (Signed)
 C/O ?high blood sugar x 2 days. Reports increased thirst and urination.  Has lost glucose meter.  Takes metformin  and insulin  for diabetes.  AAOx3. Skin warm and dry. NAD

## 2024-08-01 NOTE — ED Provider Notes (Signed)
 "  Alexander Graham Provider Note    Event Date/Time   First MD Initiated Contact with Patient 08/01/24 1151     (approximate)   History   Hyperglycemia   HPI  Alexander Graham. is a 39 y.o. male with history of diabetes presents with hyperglycemia.  Patient reports he has not had his medication in several months.  He typically takes metformin  and Lantus  per review of records from last admission in July     Physical Exam   Triage Vital Signs: ED Triage Vitals  Encounter Vitals Group     BP 08/01/24 1051 (!) 138/96     Girls Systolic BP Percentile --      Girls Diastolic BP Percentile --      Boys Systolic BP Percentile --      Boys Diastolic BP Percentile --      Pulse Rate 08/01/24 1051 (!) 109     Resp 08/01/24 1051 16     Temp 08/01/24 1051 98 F (36.7 C)     Temp src --      SpO2 08/01/24 1051 98 %     Weight 08/01/24 1050 (!) 156.5 kg (345 lb)     Height 08/01/24 1245 1.829 m (6')     Head Circumference --      Peak Flow --      Pain Score 08/01/24 1050 0     Pain Loc --      Pain Education --      Exclude from Growth Chart --     Most recent vital signs: Vitals:   08/01/24 1051 08/01/24 1400  BP: (!) 138/96 130/89  Pulse: (!) 109 90  Resp: 16 16  Temp: 98 F (36.7 C)   SpO2: 98% 98%     General: Awake, no distress.  CV:  Good peripheral perfusion.  Regular rate and rhythm Resp:  Normal effort.  No tachypnea Abd:  No distention.  Soft, nontender Other:     ED Results / Procedures / Treatments   Labs (all labs ordered are listed, but only abnormal results are displayed) Labs Reviewed  BASIC METABOLIC PANEL WITH GFR - Abnormal; Notable for the following components:      Result Value   Sodium 131 (*)    Chloride 96 (*)    CO2 20 (*)    Glucose, Bld 463 (*)    All other components within normal limits  CBC - Abnormal; Notable for the following components:   RBC 6.13 (*)    All other components within normal  limits  URINALYSIS, ROUTINE W REFLEX MICROSCOPIC - Abnormal; Notable for the following components:   Color, Urine YELLOW (*)    APPearance HAZY (*)    Specific Gravity, Urine 1.032 (*)    Glucose, UA >=500 (*)    Hgb urine dipstick SMALL (*)    Protein, ur 100 (*)    All other components within normal limits  BETA-HYDROXYBUTYRIC ACID - Abnormal; Notable for the following components:   Beta-Hydroxybutyric Acid 0.31 (*)    All other components within normal limits  CBG MONITORING, ED - Abnormal; Notable for the following components:   Glucose-Capillary 488 (*)    All other components within normal limits  CBG MONITORING, ED - Abnormal; Notable for the following components:   Glucose-Capillary 371 (*)    All other components within normal limits     EKG     RADIOLOGY     PROCEDURES:  Critical  Care performed:   Procedures   MEDICATIONS ORDERED IN ED: Medications  0.9 %  sodium chloride  infusion (0 mLs Intravenous Stopped 08/01/24 1359)  insulin  aspart (novoLOG ) injection 8 Units (8 Units Intravenous Given 08/01/24 1202)     IMPRESSION / MDM / ASSESSMENT AND PLAN / ED COURSE  I reviewed the triage vital signs and the nursing notes. Patient's presentation is most consistent with acute presentation with potential threat to life or bodily function.  Patient has been out of his diabetes medication for quite some time, initial glucose is elevated on fingerstick, differential includes hyperglycemia, dehydration, DKA  Lab work is overall reassuring, CO2, beta hydroxybutyric acid and anion gap not consistent with DKA  Patient will be treated with IV fluids, IV insulin   No signs of infection on lab work.  He is feeling well after treatment, medications have been refilled, strongly urged him to follow-up closely with PCP and to take medications as prescribed.        FINAL CLINICAL IMPRESSION(S) / ED DIAGNOSES   Final diagnoses:  Hyperglycemia     Rx / DC Orders    ED Discharge Orders          Ordered    insulin  glargine (LANTUS ) 100 UNIT/ML Solostar Pen  Daily at bedtime        08/01/24 1343    metFORMIN  (GLUCOPHAGE ) 500 MG tablet  2 times daily with meals        08/01/24 1343             Note:  This document was prepared using Dragon voice recognition software and may include unintentional dictation errors.   Alexander Charleston, MD 08/01/24 1435  "
